# Patient Record
Sex: Female | Born: 1955 | Race: Black or African American | Hispanic: No | Marital: Single | Smoking: Heavy tobacco smoker
Health system: Southern US, Community
[De-identification: ages and names within clinical notes are randomized; demographics above are authoritative.]

## PROBLEM LIST (undated history)

## (undated) DIAGNOSIS — T7840XA Allergy, unspecified, initial encounter: Secondary | ICD-10-CM

## (undated) DIAGNOSIS — J349 Unspecified disorder of nose and nasal sinuses: Secondary | ICD-10-CM

## (undated) DIAGNOSIS — N189 Chronic kidney disease, unspecified: Secondary | ICD-10-CM

## (undated) DIAGNOSIS — J3489 Other specified disorders of nose and nasal sinuses: Secondary | ICD-10-CM

## (undated) DIAGNOSIS — I119 Hypertensive heart disease without heart failure: Secondary | ICD-10-CM

## (undated) DIAGNOSIS — I10 Essential (primary) hypertension: Secondary | ICD-10-CM

## (undated) HISTORY — DX: Other specified disorders of nose and nasal sinuses: J34.89

## (undated) HISTORY — DX: Essential (primary) hypertension: I10

## (undated) HISTORY — DX: Chronic kidney disease, unspecified: N18.9

## (undated) HISTORY — DX: Unspecified disorder of nose and nasal sinuses: J34.9

## (undated) HISTORY — DX: Hypertensive heart disease without heart failure: I11.9

## (undated) HISTORY — DX: Allergy, unspecified, initial encounter: T78.40XA

---

## 2011-09-05 ENCOUNTER — Other Ambulatory Visit: Payer: Self-pay | Admitting: Internal Medicine

## 2011-09-05 DIAGNOSIS — Z1231 Encounter for screening mammogram for malignant neoplasm of breast: Secondary | ICD-10-CM

## 2011-09-15 ENCOUNTER — Ambulatory Visit: Payer: Self-pay

## 2013-03-25 ENCOUNTER — Other Ambulatory Visit: Payer: Self-pay

## 2013-03-25 DIAGNOSIS — Z1231 Encounter for screening mammogram for malignant neoplasm of breast: Secondary | ICD-10-CM

## 2013-04-19 ENCOUNTER — Ambulatory Visit
Admission: RE | Admit: 2013-04-19 | Discharge: 2013-04-19 | Disposition: A | Discharge status: Home / Self Care | Payer: BC Managed Care – PPO | Source: Ambulatory Visit

## 2013-04-19 DIAGNOSIS — Z1231 Encounter for screening mammogram for malignant neoplasm of breast: Secondary | ICD-10-CM

## 2014-04-07 ENCOUNTER — Other Ambulatory Visit: Payer: Self-pay

## 2014-04-07 DIAGNOSIS — Z1231 Encounter for screening mammogram for malignant neoplasm of breast: Secondary | ICD-10-CM

## 2014-05-19 ENCOUNTER — Ambulatory Visit
Admission: RE | Admit: 2014-05-19 | Discharge: 2014-05-19 | Disposition: A | Discharge status: Home / Self Care | Payer: BC Managed Care – PPO | Source: Ambulatory Visit

## 2014-05-19 ENCOUNTER — Encounter (INDEPENDENT_AMBULATORY_CARE_PROVIDER_SITE_OTHER): Payer: Self-pay

## 2014-05-19 DIAGNOSIS — Z1231 Encounter for screening mammogram for malignant neoplasm of breast: Secondary | ICD-10-CM

## 2015-01-27 ENCOUNTER — Encounter: Payer: Self-pay | Admitting: Vascular Surgery

## 2015-01-27 ENCOUNTER — Other Ambulatory Visit: Payer: Self-pay

## 2015-01-27 DIAGNOSIS — I83813 Varicose veins of bilateral lower extremities with pain: Secondary | ICD-10-CM

## 2015-03-24 ENCOUNTER — Encounter (HOSPITAL_COMMUNITY): Payer: BC Managed Care – PPO

## 2015-03-24 ENCOUNTER — Encounter: Payer: BC Managed Care – PPO | Admitting: Vascular Surgery

## 2015-04-13 ENCOUNTER — Other Ambulatory Visit: Payer: Self-pay

## 2015-04-13 DIAGNOSIS — Z1231 Encounter for screening mammogram for malignant neoplasm of breast: Secondary | ICD-10-CM

## 2015-05-21 ENCOUNTER — Ambulatory Visit
Admission: RE | Admit: 2015-05-21 | Discharge: 2015-05-21 | Disposition: A | Discharge status: Home / Self Care | Payer: BC Managed Care – PPO | Source: Ambulatory Visit

## 2015-05-21 DIAGNOSIS — Z1231 Encounter for screening mammogram for malignant neoplasm of breast: Secondary | ICD-10-CM

## 2015-06-08 LAB — HM COLONOSCOPY

## 2016-04-11 ENCOUNTER — Other Ambulatory Visit: Payer: Self-pay | Admitting: Internal Medicine

## 2016-04-11 DIAGNOSIS — Z1231 Encounter for screening mammogram for malignant neoplasm of breast: Secondary | ICD-10-CM

## 2016-05-24 ENCOUNTER — Ambulatory Visit
Admission: RE | Admit: 2016-05-24 | Discharge: 2016-05-24 | Disposition: A | Discharge status: Home / Self Care | Payer: BC Managed Care – PPO | Source: Ambulatory Visit | Attending: Internal Medicine | Admitting: Internal Medicine

## 2016-05-24 DIAGNOSIS — Z1231 Encounter for screening mammogram for malignant neoplasm of breast: Secondary | ICD-10-CM

## 2016-07-26 ENCOUNTER — Other Ambulatory Visit: Payer: Self-pay | Admitting: Internal Medicine

## 2016-07-26 DIAGNOSIS — E2839 Other primary ovarian failure: Secondary | ICD-10-CM

## 2016-10-27 ENCOUNTER — Ambulatory Visit
Admission: RE | Admit: 2016-10-27 | Discharge: 2016-10-27 | Disposition: A | Discharge status: Home / Self Care | Payer: BC Managed Care – PPO | Source: Ambulatory Visit | Attending: Internal Medicine | Admitting: Internal Medicine

## 2016-10-27 DIAGNOSIS — E2839 Other primary ovarian failure: Secondary | ICD-10-CM

## 2016-11-29 ENCOUNTER — Institutional Professional Consult (permissible substitution): Payer: BC Managed Care – PPO | Admitting: Neurology

## 2016-12-01 ENCOUNTER — Encounter: Payer: Self-pay | Admitting: Neurology

## 2016-12-05 ENCOUNTER — Encounter: Payer: Self-pay | Admitting: Neurology

## 2016-12-05 ENCOUNTER — Ambulatory Visit (INDEPENDENT_AMBULATORY_CARE_PROVIDER_SITE_OTHER): Payer: BC Managed Care – PPO | Admitting: Neurology

## 2016-12-05 ENCOUNTER — Encounter (INDEPENDENT_AMBULATORY_CARE_PROVIDER_SITE_OTHER): Payer: Self-pay

## 2016-12-05 VITALS — BP 130/83 | HR 100 | Ht 65.0 in | Wt 167.0 lb

## 2016-12-05 DIAGNOSIS — Z720 Tobacco use: Secondary | ICD-10-CM | POA: Diagnosis not present

## 2016-12-05 DIAGNOSIS — R0683 Snoring: Secondary | ICD-10-CM

## 2016-12-05 DIAGNOSIS — G4763 Sleep related bruxism: Secondary | ICD-10-CM | POA: Diagnosis not present

## 2016-12-05 DIAGNOSIS — N951 Menopausal and female climacteric states: Secondary | ICD-10-CM | POA: Diagnosis not present

## 2016-12-05 DIAGNOSIS — J322 Chronic ethmoidal sinusitis: Secondary | ICD-10-CM

## 2016-12-05 DIAGNOSIS — F17200 Nicotine dependence, unspecified, uncomplicated: Secondary | ICD-10-CM | POA: Insufficient documentation

## 2016-12-05 NOTE — Progress Notes (Signed)
SLEEP MEDICINE CLINIC   Provider:  Melvyn Novas, M D  Primary Care Physician:  Dorothyann Peng, MD   Referring Provider: Dorothyann Peng, MD    Chief Complaint  Patient presents with  . New Patient (Initial Visit)    HPI:  Samantha Gomez is a 61 y.o. female , seen here as in a referral  from Dr. Allyne Gee for a sleep consultation,  Samantha Gomez is a 61 year old african american lady with bruxism, hot flashes at night, ongoing tobacco smoker. Her son had reported she snores.    Chief complaint according to patient :  Sleep habits are as follows: she sleeps with a fan in a room that quiet and dark and cool, yet she feels hot. Her bedtime is usually 8 PM , on vacation it will be 10 Pm. Yes, she watches TV for 30 minutes and will be asleep. TV is on a timer. She sleeps on her side, and she wakes on her back.  She sleeps with 1 pillow. She has rarely bathroom breaks, her arousal is usually between 2 and 4 AM when she feels hot. No palpitations, no nausea no headaches, no dizziness. She rarely has vivid dreams.  She wakes spontaneously at 5.30 AM, no need for an alarm. She feels refreshed , restored.   Sleep medical history and family sleep history: One sister- she  had OSA- hypersomnia/ considered narcolepsy.   Parents were not affected.    Social history:  Works from 8.15 Am through 4.15 in [employer]. Tobacco user for more than 3 decades, 1ppd , ETOH ( wine )  , 2 glasses a week. caffeine use  - drank Cola 2-3 a day ( gave this up last 6 month) , now in AM 2 cups of coffee,  No iced tea.   Review of Systems: Out of a complete 14 system review, the patient complains of only the following symptoms, and all other reviewed systems are negative. Snoring, coughing, phlegm, sinusitis. Had Pneumonia .   Epworth score 2  , Fatigue severity score 9  , depression score 2/15    I reviewed medication, nutritional supplements and her recent lab tests form Dr Allyne Gee. HbA1c 5.6, total  cholesterol under 200,  Social History   Social History  . Marital status: Single    Spouse name: N/A  . Number of children: N/A  . Years of education: N/A   Occupational History  . Not on file.   Social History Main Topics  . Smoking status: Heavy Tobacco Smoker    Years: 44.00  . Smokeless tobacco: Never Used  . Alcohol use Not on file  . Drug use: Unknown  . Sexual activity: Not on file   Other Topics Concern  . Not on file   Social History Narrative  . No narrative on file    No family history on file.  Past Medical History:  Diagnosis Date  . Benign hypertensive heart disease   . Chronic kidney disease   . Disorder of nasal cavity   . Hypertension     No past surgical history on file.  Current Outpatient Prescriptions  Medication Sig Dispense Refill  . b complex vitamins tablet Take 1 tablet by mouth daily.    . Cholecalciferol (VITAMIN D-3) 5000 units TABS Take by mouth daily.    . niacin 250 MG tablet Take 250 mg by mouth daily.    . Olmesartan-Amlodipine-HCTZ (TRIBENZOR) 40-5-25 MG TABS Take by mouth daily.     No  current facility-administered medications for this visit.     Allergies as of 12/05/2016  . (Not on File)    Vitals: BP 130/83   Pulse 100   Ht 5\' 5"  (1.651 m)   Wt 167 lb (75.8 kg)   BMI 27.79 kg/m  Last Weight:  Wt Readings from Last 1 Encounters:  12/05/16 167 lb (75.8 kg)   ZOX:WRUEBMI:Body mass index is 27.79 kg/m.     Last Height:   Ht Readings from Last 1 Encounters:  12/05/16 5\' 5"  (1.651 m)    Physical exam:  General: The patient is awake, alert and appears not in acute distress. The patient is well groomed. Head: Normocephalic, atraumatic. Neck is supple. Mallampati 4,  neck circumference:13.5 . Nasal airflow congested , TMJ click is evident . Retrognathia is mildly seen.  Cardiovascular:  Regular rate and rhythm , without  murmurs or carotid bruit, and without distended neck veins. Respiratory: Lungs, rhonci  noted, no  wheezing  to auscultation. Skin:  Without evidence of edema, or rash Trunk: BMI is 28. The patient's posture is erect   Neurologic exam : The patient is awake and alert, oriented to place and time.   Cranial nerves: Pupils are equal and briskly reactive to light.  Readers worn- no nystagmus or diplopia. Hearing to finger rub intact.   Facial sensation intact to fine touch.  Facial motor strength is symmetric and tongue and uvula move midline. Shoulder shrug was symmetrical.   Motor exam:   Normal tone, muscle bulk and symmetric strength in all extremities.  Sensory:  Fine touch, pinprick and vibration were tested in all extremities. Proprioception tested in the upper extremities was normal. Coordination: Rapid alternating movements in the fingers/hands was normal. Finger-to-nose maneuver  normal without evidence of ataxia, dysmetria or tremor. Gait and station: Patient walks without assistive device and is able unassisted to climb up to the exam table. Strength within normal limits.  Stance is stable and normal.   Deep tendon reflexes: in the  upper and lower extremities are symmetric and intact. Babinski maneuver response is downgoing.    Assessment:  After physical and neurologic examination, review of laboratory studies,  Personal review of imaging studies, reports of other /same  Imaging studies, results of polysomnography and / or neurophysiology testing and pre-existing records as far as provided in visit., my assessment is   1) Samantha Gomez's referral was based on her dentists concern for upper airway narrowing and bruxism. She is a smoker and she has been told that she snores. I will order an attended sleep study with CO2 in a  tobacco user. She has been suffering from delayed menopausal sleep interruptions- started at age [age].  May benefit from a treatment ( HRT low dose combi)  I will check for LMs, and for hypoxemia , OSA .    The patient was advised of the nature of the  diagnosed disorder , the treatment options and the  risks for general health and wellness arising from not treating the condition.   I spent more than 35 minutes of face to face time with the patient.  Greater than 50% of time was spent in counseling and coordination of care. We have discussed the diagnosis and differential and I answered the patient's questions.    Plan:  Treatment plan and additional workup :  SPLIT night PSG with capnography in tobacco user, snorer and with nocturnal heat-attacks.     Melvyn NovasARMEN Dempsy Damiano, MD 12/05/2016, 11:48 AM  Certified in  Neurology by ABPN Certified in [place] by [facility] [address] [place], [place] [address]

## 2016-12-05 NOTE — Patient Instructions (Signed)

## 2016-12-14 ENCOUNTER — Ambulatory Visit (INDEPENDENT_AMBULATORY_CARE_PROVIDER_SITE_OTHER): Payer: BC Managed Care – PPO | Admitting: Neurology

## 2016-12-14 DIAGNOSIS — G4763 Sleep related bruxism: Secondary | ICD-10-CM

## 2016-12-14 DIAGNOSIS — G4733 Obstructive sleep apnea (adult) (pediatric): Secondary | ICD-10-CM | POA: Diagnosis not present

## 2016-12-14 DIAGNOSIS — Z720 Tobacco use: Secondary | ICD-10-CM

## 2016-12-14 DIAGNOSIS — J322 Chronic ethmoidal sinusitis: Secondary | ICD-10-CM

## 2016-12-14 DIAGNOSIS — N951 Menopausal and female climacteric states: Secondary | ICD-10-CM

## 2016-12-14 DIAGNOSIS — R0683 Snoring: Secondary | ICD-10-CM

## 2016-12-19 ENCOUNTER — Other Ambulatory Visit: Payer: Self-pay | Admitting: Neurology

## 2016-12-19 NOTE — Addendum Note (Signed)
Addended by: Melvyn NovasHMEIER, Shahil Speegle on: 12/19/2016 12:43 PM   Modules accepted: Orders

## 2016-12-19 NOTE — Procedures (Signed)
PATIENT'S NAME:  Samantha Gomez, Samantha Gomez DOB:      02/16/1956      MR#:    [id number]     DATE OF RECORDING: 12/14/2016 REFERRING M.D.:  Dorothyann Pengobyn Sanders, MD Study Performed:   Baseline Polysomnogram HISTORY:  This 61 year old African-American female patient of Dr. Zella BallSander's presents with bruxism, hot flashes at night, is an active tobacco smoker and snores.  Here to rule out apnea; strong family history of OSA.  The patient endorsed the Epworth Sleepiness Scale at 2/ 24 points.   The patient's weight 167 pounds with a height of 65 (inches), resulting in a BMI of 27.9 kg/m2. The patient's neck circumference measured 13.5 inches.  CURRENT MEDICATIONS: Vitamin B complex; Vitamin D3; Tribenzor   PROCEDURE:  This is a multichannel digital polysomnogram utilizing the SomnoStar 11.2 system.  Electrodes and sensors were applied and monitored per AASM Specifications.   EEG, EOG, Chin and Limb EMG, were sampled at 200 Hz.  ECG, Snore and Nasal Pressure, Thermal Airflow, Respiratory Effort, CPAP Flow and Pressure, Oximetry was sampled at 50 Hz. Digital video and audio were recorded.      BASELINE STUDY  Lights Out was at 22:21 and Lights On at 05:02.  Total recording time (TRT) was 401 minutes, with a total sleep time (TST) of 331 minutes.   The patient's sleep latency was 50 minutes.  REM latency was 132.5 minutes.  The sleep efficiency was 82.5 %.     SLEEP ARCHITECTURE: WASO (Wake after sleep onset) was 19.5 minutes.  There were 8 minutes in Stage N1, 186.5 minutes Stage N2, 85.5 minutes Stage N3 and 51 minutes in Stage REM.  The percentage of Stage N1 was 2.4%, Stage N2 was 56.3%, Stage N3 was 25.8% and Stage R (REM sleep) was 15.4%.   EKG was in keeping with normal sinus rhythm (NSR).  RESPIRATORY ANALYSIS:  There were a total of 10 respiratory events:  6 obstructive apneas, 4 hypopneas with 0 respiratory event related arousals (RERAs).     The total APNEA/HYPOPNEA INDEX (AHI) was 1.8/hour and the total  RESPIRATORY DISTURBANCE INDEX was 1.8 /hour.  3 events occurred in REM sleep and 2 events in NREM. The REM AHI was 3.5 /hour, versus a non-REM AHI of 1.5. The patient spent 11.5 minutes of total sleep time in the supine position and 320 minutes in non-supine. The supine AHI was 31.3 versus a non-supine AHI of 0.8.  OXYGEN SATURATION & C02:  The Wake baseline 02 saturation was 98%, with the lowest being 88%. Time spent below 89% saturation equaled 1 minute. Total sleep time greater than CO2 40 torr was 1.00 minutes.     PERIODIC LIMB MOVEMENTS/AROUSALS:  The patient had a total of 0 Periodic Limb Movements.  The arousals were noted as: 36 were spontaneous, 0 were associated with PLMs, and 8 were associated with respiratory events.  Audio and video analysis did not show any abnormal or unusual movements, behaviors, phonations or vocalizations.   Mild Snoring was noted. EKG in NSR. Bruxism noticed in supine sleep. Post-study, the patient indicated that sleep was the same as usual.    IMPRESSION: Mild to moderate snoring.  No significant apnea, rare PLMs, NSR in EKG, no significant hypoxemia.  A physiologic sleep disorder was not identified.    RECOMMENDATIONS:  1. A follow up appointment in the [facility] at [facility] is not needed. If the patient prefers, I will discuss the findings and refer for snoring treatments through  dentist. There may be a chance to treat bruxism and snoring with one device.  2. The referring provider will be notified of the results.      I certify that I have reviewed the entire raw data recording prior to the issuance of this report in accordance with the Standards of Accreditation of the American Academy of Sleep Medicine (AASM)      Melvyn Novasarmen Deklin Bieler, MD    12-19-2016  Diplomat, American Board of Psychiatry and Neurology  Diplomat, American Board of Sleep Medicine Medical Director, [facility]

## 2016-12-20 ENCOUNTER — Telehealth: Payer: Self-pay | Admitting: Neurology

## 2016-12-20 NOTE — Telephone Encounter (Signed)
-----   Message from Carmen Dohmeier, MD sent at 12/19/2016 12:46 PM EDT ----- Please disregard the note from 12.43 PM. ERROR. 

## 2016-12-20 NOTE — Telephone Encounter (Signed)
Called to discuss sleep study results. Pt not at home. Left message for pt to call back.

## 2016-12-21 ENCOUNTER — Other Ambulatory Visit: Payer: Self-pay | Admitting: Neurology

## 2016-12-21 ENCOUNTER — Telehealth: Payer: Self-pay | Admitting: Neurology

## 2016-12-21 NOTE — Telephone Encounter (Signed)
-----   Message from Melvyn Novasarmen Dohmeier, MD sent at 12/19/2016 12:46 PM EDT ----- Please disregard the note from 12.43 PM. ERROR.

## 2016-12-21 NOTE — Telephone Encounter (Signed)
Discussed sleep study results and pt made aware that she had no evidence of sleep apnea. Explained there was mild to moderate sleep snoring with teeth grinding noted. Pt stated [facility] and would like to be referred back to them for the dental device.

## 2017-02-23 ENCOUNTER — Other Ambulatory Visit: Payer: Self-pay | Admitting: Internal Medicine

## 2017-04-25 ENCOUNTER — Other Ambulatory Visit: Payer: Self-pay | Admitting: Internal Medicine

## 2017-04-25 DIAGNOSIS — Z1231 Encounter for screening mammogram for malignant neoplasm of breast: Secondary | ICD-10-CM

## 2017-06-01 ENCOUNTER — Ambulatory Visit
Admission: RE | Admit: 2017-06-01 | Discharge: 2017-06-01 | Disposition: A | Discharge status: Home / Self Care | Payer: BC Managed Care – PPO | Source: Ambulatory Visit | Attending: Internal Medicine | Admitting: Internal Medicine

## 2017-06-01 DIAGNOSIS — Z1231 Encounter for screening mammogram for malignant neoplasm of breast: Secondary | ICD-10-CM

## 2017-06-02 ENCOUNTER — Other Ambulatory Visit: Payer: Self-pay | Admitting: Internal Medicine

## 2017-06-02 DIAGNOSIS — R928 Other abnormal and inconclusive findings on diagnostic imaging of breast: Secondary | ICD-10-CM

## 2017-06-13 ENCOUNTER — Ambulatory Visit
Admission: RE | Admit: 2017-06-13 | Discharge: 2017-06-13 | Disposition: A | Discharge status: Home / Self Care | Payer: BC Managed Care – PPO | Source: Ambulatory Visit | Attending: Internal Medicine | Admitting: Internal Medicine

## 2017-06-13 DIAGNOSIS — R928 Other abnormal and inconclusive findings on diagnostic imaging of breast: Secondary | ICD-10-CM

## 2018-03-31 ENCOUNTER — Encounter: Payer: Self-pay | Admitting: Internal Medicine

## 2018-03-31 ENCOUNTER — Ambulatory Visit (INDEPENDENT_AMBULATORY_CARE_PROVIDER_SITE_OTHER): Payer: BC Managed Care – PPO | Admitting: Internal Medicine

## 2018-03-31 VITALS — BP 122/70 | HR 88 | Temp 98.1°F | Ht 63.5 in | Wt 164.8 lb

## 2018-03-31 DIAGNOSIS — I10 Essential (primary) hypertension: Secondary | ICD-10-CM

## 2018-03-31 DIAGNOSIS — M79641 Pain in right hand: Secondary | ICD-10-CM

## 2018-03-31 DIAGNOSIS — M79642 Pain in left hand: Secondary | ICD-10-CM | POA: Diagnosis not present

## 2018-03-31 DIAGNOSIS — Z0000 Encounter for general adult medical examination without abnormal findings: Secondary | ICD-10-CM

## 2018-03-31 DIAGNOSIS — H9201 Otalgia, right ear: Secondary | ICD-10-CM | POA: Diagnosis not present

## 2018-03-31 LAB — POCT UA - MICROALBUMIN
Albumin/Creatinine Ratio, Urine, POC: <30
CREATININE, POC: 50 mg/dL
MICROALBUMIN (UR) POC: 10 mg/L

## 2018-03-31 NOTE — Progress Notes (Signed)
Subjective:     Patient ID: Samantha Gomez , female    DOB: 1956/01/22 , 62 y.o.   MRN: [id number]   Chief Complaint  Patient presents with  . Annual Exam  . Hypertension    HPI  She is here today for a full physical examination. Her last pap smear was in 2018.   Hypertension  This is a chronic problem. The current episode started more than 1 year ago. The problem has been gradually improving since onset. Pertinent negatives include no blurred vision, chest pain, headaches, palpitations or shortness of breath. Risk factors for coronary artery disease include post-menopausal state, sedentary lifestyle, smoking/tobacco exposure and stress.  She reports compliance with her meds.    Past Medical History:  Diagnosis Date  . Benign hypertensive heart disease   . Chronic kidney disease   . Disorder of nasal cavity   . Hypertension      Family History  Problem Relation Age of Onset  . Dementia Mother   . Lung cancer Father      No LMP recorded. Patient is postmenopausal.. Negative for: breast discharge, breast lump(s), breast pain and breast self exam. Associated symptoms include abnormal vaginal bleeding. Pertinent negatives include abnormal bleeding (hematology), anxiety, decreased libido, depression, difficulty falling sleep, dyspareunia, history of infertility, nocturia, sexual dysfunction, sleep disturbances, urinary incontinence, urinary urgency, vaginal discharge and vaginal itching. Diet regular.The patient states her exercise level is    . The patient's tobacco use is:  Social History   Tobacco Use  Smoking Status Light Tobacco Smoker  . Packs/day: 0.25  . Years: 44.00  . Pack years: 11.00  Smokeless Tobacco Never Used  . She has been exposed to passive smoke. The patient's alcohol use is:  Social History   Substance and Sexual Activity  Alcohol Use Yes  . Alcohol/week: 2.0 standard drinks  . Types: 2 Shots of liquor per week  . Additional information: Last pap 2018  next one scheduled for 2021.    Current Outpatient Medications:  .  b complex vitamins tablet, Take 1 tablet by mouth daily., Disp: , Rfl:  .  Cholecalciferol (VITAMIN D-3) 5000 units TABS, Take by mouth daily., Disp: , Rfl:  .  niacin 250 MG tablet, Take 250 mg by mouth daily., Disp: , Rfl:  .  Olmesartan-Amlodipine-HCTZ (TRIBENZOR) 40-5-25 MG TABS, Take by mouth daily., Disp: , Rfl:     No Known Allergies   Review of Systems  Constitutional: Negative.   HENT: Ear pain: c/o R ear pain. feels stopped up. Denies hearing loss.   Eyes: Negative.  Negative for blurred vision.  Respiratory: Negative.  Negative for shortness of breath.   Cardiovascular: Negative.  Negative for chest pain and palpitations.  Gastrointestinal: Negative.   Endocrine: Negative.   Genitourinary: Negative.   Musculoskeletal: Positive for arthralgias (c/o b/l hand pain. c/o stiffness upon awakening. hands feel tight. ).  Skin: Negative.   Allergic/Immunologic: Negative.   Neurological: Negative.  Negative for headaches.  Hematological: Negative.   Psychiatric/Behavioral: Negative.      Today's Vitals   03/31/18 0935  BP: 122/70  Pulse: 88  Temp: 98.1 F (36.7 C)  TempSrc: Oral  Weight: 164 lb 12.8 oz (74.8 kg)  Height: 5' 3.5" (1.613 m)   Body mass index is 28.74 kg/m.   Objective:  Physical Exam  Constitutional: She is oriented to person, place, and time. She appears well-developed and well-nourished.  HENT:  Head: Normocephalic and atraumatic.  Right Ear: Hearing, tympanic membrane,  external ear and ear canal normal.  Left Ear: Hearing, tympanic membrane, external ear and ear canal normal.  Nose: Nose normal.  Mouth/Throat: Oropharynx is clear and moist.  Eyes: Pupils are equal, round, and reactive to light. Conjunctivae and EOM are normal.  Neck: Normal range of motion. Neck supple.  Cardiovascular: Normal rate, regular rhythm, normal heart sounds and intact distal pulses.  Pulmonary/Chest:  Effort normal and breath sounds normal. Right breast exhibits no inverted nipple, no mass, no nipple discharge, no skin change and no tenderness. Left breast exhibits no inverted nipple, no mass, no nipple discharge, no skin change and no tenderness.  Abdominal: Soft. Bowel sounds are normal.  Genitourinary:  Genitourinary Comments: deferred  Musculoskeletal:  Pos squeeze test on the right  Neurological: She is alert and oriented to person, place, and time.  Skin: Skin is warm and dry.  Psychiatric: She has a normal mood and affect.  Nursing note and vitals reviewed.       Assessment And Plan:     1. Routine general medical examination at health care facility  A full exam was performed. Importance of monthly self breast exams was discussed with the patient.  PATIENT HAS BEEN ADVISED TO GET 30-45 MINUTES REGULAR EXERCISE NO LESS THAN FOUR TO FIVE DAYS PER WEEK - BOTH WEIGHTBEARING EXERCISES AND AEROBIC ARE RECOMMENDED.  SHE IS ADVISED TO FOLLOW A HEALTHY DIET WITH AT LEAST SIX FRUITS/VEGGIES PER DAY, DECREASE INTAKE OF RED MEAT, AND TO INCREASE FISH INTAKE TO TWO DAYS PER WEEK.  MEATS/FISH SHOULD NOT BE FRIED, BAKED OR BROILED IS PREFERABLE.  I SUGGEST WEARING SPF 50 SUNSCREEN ON EXPOSED PARTS AND ESPECIALLY WHEN IN THE DIRECT SUNLIGHT FOR AN EXTENDED PERIOD OF TIME.  PLEASE AVOID FAST FOOD RESTAURANTS AND INCREASE YOUR WATER INTAKE.  - CMP14+EGFR - CBC - Lipid panel - Hemoglobin A1c - ANA, IFA (with reflex)  2. Essential hypertension, benign  Well controlled. She will continue with current meds. She is encouraged to avoid adding salt to her foods. She will RTO in six months for re-evaluation.   - EKG 12-Lead  3. Bilateral hand pain  Squeeze test positive on the right. I will check an ANA today.   4. Otalgia of right ear  No abnormalities noted. She will try otc loratadine daily prn. She will call me next week to let me know how she is feeling. I will consider phenylephrine  product if her sx persist.     Maximino Greenland, MD

## 2018-03-31 NOTE — Patient Instructions (Signed)

## 2018-04-02 LAB — POCT URINALYSIS DIPSTICK
Bilirubin, UA: NEGATIVE
GLUCOSE UA: NEGATIVE
Ketones, UA: NEGATIVE
Leukocytes, UA: NEGATIVE
Nitrite, UA: NEGATIVE
Protein, UA: NEGATIVE
Urobilinogen, UA: 0.2 E.U./dL
pH, UA: 6 (ref 5.0–8.0)

## 2018-04-02 LAB — CMP14+EGFR
A/G RATIO: 2.4 — AB (ref 1.2–2.2)
ALT: 28 IU/L (ref 0–32)
AST: 31 IU/L (ref 0–40)
Albumin: 4.3 g/dL (ref 3.6–4.8)
Alkaline Phosphatase: 77 IU/L (ref 39–117)
BUN/Creatinine Ratio: 15 (ref 12–28)
BUN: 13 mg/dL (ref 8–27)
Bilirubin Total: 0.3 mg/dL (ref 0.0–1.2)
CO2: 23 mmol/L (ref 20–29)
Calcium: 9.2 mg/dL (ref 8.7–10.3)
Chloride: 99 mmol/L (ref 96–106)
Creatinine, Ser: 0.89 mg/dL (ref 0.57–1.00)
GFR, EST AFRICAN AMERICAN: 80 mL/min/{1.73_m2} (ref >59)
GFR, EST NON AFRICAN AMERICAN: 69 mL/min/{1.73_m2} (ref >59)
GLOBULIN, TOTAL: 1.8 g/dL (ref 1.5–4.5)
Glucose: 96 mg/dL (ref 65–99)
POTASSIUM: 4 mmol/L (ref 3.5–5.2)
SODIUM: 139 mmol/L (ref 134–144)
Total Protein: 6.1 g/dL (ref 6.0–8.5)

## 2018-04-02 LAB — LIPID PANEL
Chol/HDL Ratio: 2.3 ratio (ref 0.0–4.4)
Cholesterol, Total: 197 mg/dL (ref 100–199)
HDL: 87 mg/dL (ref >39)
LDL Calculated: 101 mg/dL — ABNORMAL HIGH (ref 0–99)
Triglycerides: 45 mg/dL (ref 0–149)
VLDL Cholesterol Cal: 9 mg/dL (ref 5–40)

## 2018-04-02 LAB — CBC
Hematocrit: 40.5 % (ref 34.0–46.6)
Hemoglobin: 13.4 g/dL (ref 11.1–15.9)
MCH: 28.9 pg (ref 26.6–33.0)
MCHC: 33.1 g/dL (ref 31.5–35.7)
MCV: 87 fL (ref 79–97)
PLATELETS: 233 10*3/uL (ref 150–450)
RBC: 4.64 x10E6/uL (ref 3.77–5.28)
RDW: 13 % (ref 12.3–15.4)
WBC: 3.9 10*3/uL (ref 3.4–10.8)

## 2018-04-02 LAB — ANTINUCLEAR ANTIBODIES, IFA: ANA TITER 1: NEGATIVE

## 2018-04-02 LAB — HEMOGLOBIN A1C
Est. average glucose Bld gHb Est-mCnc: 108 mg/dL
Hgb A1c MFr Bld: 5.4 % (ref 4.8–5.6)

## 2018-04-02 NOTE — Progress Notes (Signed)
Your liver and kidney fxn are nl. Your blood count is nl.  Your chol is great. You are not prediabetic. Your ANA is neg - this is test to eval for possible rheumatoid arthritis. 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 You may try otc Aspercreme - apply to affected areas bid as needed. I hope this works for you.

## 2018-07-24 ENCOUNTER — Other Ambulatory Visit: Payer: Self-pay | Admitting: Internal Medicine

## 2018-07-24 DIAGNOSIS — Z1231 Encounter for screening mammogram for malignant neoplasm of breast: Secondary | ICD-10-CM

## 2018-07-27 ENCOUNTER — Other Ambulatory Visit: Payer: Self-pay | Admitting: Internal Medicine

## 2018-08-21 ENCOUNTER — Ambulatory Visit: Payer: BC Managed Care – PPO

## 2018-09-18 ENCOUNTER — Ambulatory Visit: Payer: BC Managed Care – PPO

## 2018-09-22 ENCOUNTER — Ambulatory Visit: Payer: BC Managed Care – PPO | Admitting: Internal Medicine

## 2018-09-24 ENCOUNTER — Other Ambulatory Visit: Payer: Self-pay | Admitting: Internal Medicine

## 2018-09-24 ENCOUNTER — Ambulatory Visit (INDEPENDENT_AMBULATORY_CARE_PROVIDER_SITE_OTHER): Payer: BC Managed Care – PPO | Admitting: Internal Medicine

## 2018-09-24 ENCOUNTER — Other Ambulatory Visit: Payer: Self-pay

## 2018-09-24 ENCOUNTER — Encounter: Payer: Self-pay | Admitting: Internal Medicine

## 2018-09-24 VITALS — BP 110/64 | HR 87 | Temp 98.1°F | Ht 63.5 in | Wt 167.2 lb

## 2018-09-24 DIAGNOSIS — Z122 Encounter for screening for malignant neoplasm of respiratory organs: Secondary | ICD-10-CM | POA: Diagnosis not present

## 2018-09-24 DIAGNOSIS — F17210 Nicotine dependence, cigarettes, uncomplicated: Secondary | ICD-10-CM

## 2018-09-24 DIAGNOSIS — I10 Essential (primary) hypertension: Secondary | ICD-10-CM

## 2018-09-24 DIAGNOSIS — E663 Overweight: Secondary | ICD-10-CM

## 2018-09-24 NOTE — Progress Notes (Signed)
Subjective:     Patient ID: Samantha Gomez , female    DOB: 05/06/1956 , 63 y.o.   MRN: [id number]   Chief Complaint  Patient presents with  . Hypertension    HPI  She is here today for bp check. She has been following social distancing/stay at home orders. She does not mind being at home. She loves to garden.   Hypertension  This is a chronic problem. The current episode started more than 1 year ago. The problem is controlled. Pertinent negatives include no blurred vision, chest pain, palpitations or shortness of breath. Risk factors for coronary artery disease include smoking/tobacco exposure, stress and post-menopausal state.     Past Medical History:  Diagnosis Date  . Benign hypertensive heart disease   . Chronic kidney disease   . Disorder of nasal cavity   . Hypertension      Family History  Problem Relation Age of Onset  . Dementia Mother   . Lung cancer Father      Current Outpatient Medications:  .  b complex vitamins tablet, Take 1 tablet by mouth daily., Disp: , Rfl:  .  Cholecalciferol (VITAMIN D-3) 5000 units TABS, Take by mouth daily., Disp: , Rfl:  .  niacin 250 MG tablet, Take 250 mg by mouth daily., Disp: , Rfl:  .  TRIBENZOR 40-5-25 MG TABS, TAKE 1 TABLET BY MOUTH DAILY., Disp: 90 tablet, Rfl: 2   No Known Allergies   Review of Systems  Constitutional: Negative.   Eyes: Negative for blurred vision.  Respiratory: Negative.  Negative for shortness of breath.   Cardiovascular: Negative.  Negative for chest pain and palpitations.  Gastrointestinal: Negative.   Neurological: Negative.   Psychiatric/Behavioral: Negative.      Today's Vitals   09/24/18 1111  BP: 110/64  Pulse: 87  Temp: 98.1 F (36.7 C)  TempSrc: Oral  Weight: 167 lb 3.2 oz (75.8 kg)  Height: 5' 3.5" (1.613 m)   Body mass index is 29.15 kg/m.   Objective:  Physical Exam Vitals signs and nursing note reviewed.  Constitutional:      Appearance: Normal appearance.  HENT:    Head: Normocephalic and atraumatic.  Cardiovascular:     Rate and Rhythm: Normal rate and regular rhythm.     Heart sounds: Normal heart sounds.  Pulmonary:     Effort: Pulmonary effort is normal.     Breath sounds: Normal breath sounds.  Skin:    General: Skin is warm.  Neurological:     General: No focal deficit present.     Mental Status: She is alert.  Psychiatric:        Mood and Affect: Mood normal.        Behavior: Behavior normal.         Assessment And Plan:     1. Essential hypertension, benign  Well controlled. She will continue with current meds. She is encouraged to avoid adding salt to her foods. She will rto in six months for a full physical examination.   - CMP14+EGFR - Lipid Profile  2. Cigarette nicotine dependence without complication  She has greater than 30 pack year history of tobacco use. She does not wish to take medications to help her quit. She is encouraged to avoid smoking both in her car and in her home. She is also encouraged to decrease number of cigs smoked per day.   3. Overweight with body mass index (BMI) 25.0-29.9  BMI 29. She is encouraged to exercise   no less than 30 minutes five days per week. Importance of dietary compliance was also discussed with the patient.   4. Encounter for screening for malignant neoplasm of respiratory organs  - CT CHEST LUNG CA SCREEN LOW DOSE W/O CM; Future        Maximino Greenland, MD    THE PATIENT IS ENCOURAGED TO PRACTICE SOCIAL DISTANCING DUE TO THE COVID-19 PANDEMIC.

## 2018-09-24 NOTE — Patient Instructions (Signed)
Smoking and Musculoskeletal Health Smoking is bad for your health. Most people know that smoking causes lung disease, heart disease, and cancer. But people may not realize that it also affects their bones, muscles, and joints (musculoskeletal system). When you smoke, the effects on your lungs and heart result in less oxygen for your musculoskeletal system. This can lead to poor bone and joint health. How can smoking affect my musculoskeletal health? Smoking can:  Increase your risk of having weak, thin bones (osteoporosis). Elderly smokers are at higher risk for bone fractures related to osteoporosis.  Decrease the ability of bone-forming cells to make and replace bone (in addition to reducing oxygen and blood flow).  Reduce your body's ability to absorb calcium from your diet. Less calcium means weaker bones.  Interfere with the breakdown of the female hormone estrogen. Smoking lowers estrogen, which is a hormone that helps keep bones strong. Women who smoke may have earlier menopause. Menopause is a risk factor for osteoporosis.  Weaken the tissues that attach bones to muscles (tendons). This can lead to shoulder, back, and other joint injuries.  Increase your risk of rheumatoid arthritis or make the condition worse if you already have it.  Slow down healing and increase your risk of infection and other complications if you have a bone fracture or surgery that involves your musculoskeletal system.  Make you get out of breath easily. This can keep you from getting the exercise you need to keep your bones and joints healthy.  Decrease your appetite and body mass. You may lose weight and muscle strength. This can put you at higher risk for muscle injury, joint injury, and broken bones. What actions can I take to prevent musculoskeletal problems? Quit smoking      Do not start smoking. Quit if you already do. Even stopping later in life can improve musculoskeletal health.  Do not use any  products that contain nicotine or tobacco. Do not replace cigarette smoking with e-cigarettes. The safety of e-cigarettes is not known, and some may contain harmful chemicals.  Make a plan to quit smoking and commit to it. Look for programs to help you, and ask your health care provider for recommendations and ideas.  Talk with your health care provider about using nicotine replacement medicines to help you quit, such as gum, lozenges, patches, sprays, or pills. Make other lifestyle changes   Eat a healthy diet that includes calcium and vitamin D. These nutrients are important for bone health. ? Calcium is found in dairy foods and green leafy vegetables. ? Vitamin D is found in eggs, fish, and liver. ? Many foods also have vitamin D and calcium added to them (are fortified). ? Ask your health care provider if you would benefit from taking a supplement.  Get out in the sunshine for a short time every day. This increases production of vitamin D.  Get 30 minutes of exercise at least 5 days a week. Weight-bearing and strength exercises are best for musculoskeletal health. Ask your health care provider what type of exercise is safe for you.  Do not drink alcohol if: ? Your health care provider tells you not to drink. ? You are pregnant, may be pregnant, or are planning to become pregnant.  If you drink alcohol, limit how much you have: ? 0-1 drink a day for women. ? 0-2 drinks a day for men.  Be aware of how much alcohol is in your drink. In the [place] one drink equals one 12 oz bottle   of beer (355 mL), one 5 oz glass of wine (148 mL), or one 1 oz glass of hard liquor (44 mL). Where to find more information You may find more information about smoking, musculoskeletal health, and quitting smoking from:  American Academy of Orthopaedic Surgeons: orthoinfo.aaos.org  National Institutes of Health, Osteoporosis and Related Bone Diseases National Resource Center: bones.nih.gov  HelpGuide.org:  helpguide.org  Smokefree.gov: smokefree.gov  American Lung Association: lung.org Contact a health care provider if:  You need help to quit smoking. Summary  When you smoke, the effects on your lungs and heart result in less oxygen for your musculoskeletal system.  Even stopping smoking later in life can improve musculoskeletal health.  Do not use any products that contain nicotine or tobacco, such as cigarettes and e-cigarettes.  If you need help quitting, ask your health care provider. This information is not intended to replace advice given to you by your health care provider. Make sure you discuss any questions you have with your health care provider. Document Released: 09/04/2017 Document Revised: 09/04/2017 Document Reviewed: 09/04/2017 Elsevier Interactive Patient Education  2019 Elsevier Inc.  

## 2018-09-25 LAB — CMP14+EGFR
ALT: 25 IU/L (ref 0–32)
AST: 23 IU/L (ref 0–40)
Albumin/Globulin Ratio: 2.2 (ref 1.2–2.2)
Albumin: 4.2 g/dL (ref 3.8–4.8)
Alkaline Phosphatase: 81 IU/L (ref 39–117)
BUN/Creatinine Ratio: 14 (ref 12–28)
BUN: 14 mg/dL (ref 8–27)
Bilirubin Total: 0.3 mg/dL (ref 0.0–1.2)
CO2: 24 mmol/L (ref 20–29)
Calcium: 9.1 mg/dL (ref 8.7–10.3)
Chloride: 100 mmol/L (ref 96–106)
Creatinine, Ser: 0.98 mg/dL (ref 0.57–1.00)
GFR calc Af Amer: 71 mL/min/{1.73_m2} (ref >59)
GFR calc non Af Amer: 62 mL/min/{1.73_m2} (ref >59)
Globulin, Total: 1.9 g/dL (ref 1.5–4.5)
Glucose: 103 mg/dL — ABNORMAL HIGH (ref 65–99)
Potassium: 3.8 mmol/L (ref 3.5–5.2)
Sodium: 138 mmol/L (ref 134–144)
Total Protein: 6.1 g/dL (ref 6.0–8.5)

## 2018-09-25 LAB — LIPID PANEL
Chol/HDL Ratio: 2 ratio (ref 0.0–4.4)
Cholesterol, Total: 196 mg/dL (ref 100–199)
HDL: 97 mg/dL (ref >39)
LDL Calculated: 89 mg/dL (ref 0–99)
Triglycerides: 50 mg/dL (ref 0–149)
VLDL Cholesterol Cal: 10 mg/dL (ref 5–40)

## 2018-11-13 ENCOUNTER — Other Ambulatory Visit: Payer: Self-pay

## 2018-11-13 ENCOUNTER — Ambulatory Visit
Admission: RE | Admit: 2018-11-13 | Discharge: 2018-11-13 | Disposition: A | Discharge status: Home / Self Care | Payer: BC Managed Care – PPO | Source: Ambulatory Visit | Attending: Internal Medicine | Admitting: Internal Medicine

## 2018-11-13 DIAGNOSIS — Z1231 Encounter for screening mammogram for malignant neoplasm of breast: Secondary | ICD-10-CM

## 2018-11-26 IMAGING — MG DIGITAL DIAGNOSTIC UNILATERAL RIGHT MAMMOGRAM
3 series · 3 of 3 positions shown · non-contrast
Comparison: Previous exam(s).

CLINICAL DATA: Callback from screening mammogram for calcifications
right breast

EXAM:
DIGITAL DIAGNOSTIC RIGHT MAMMOGRAM

[R CC]
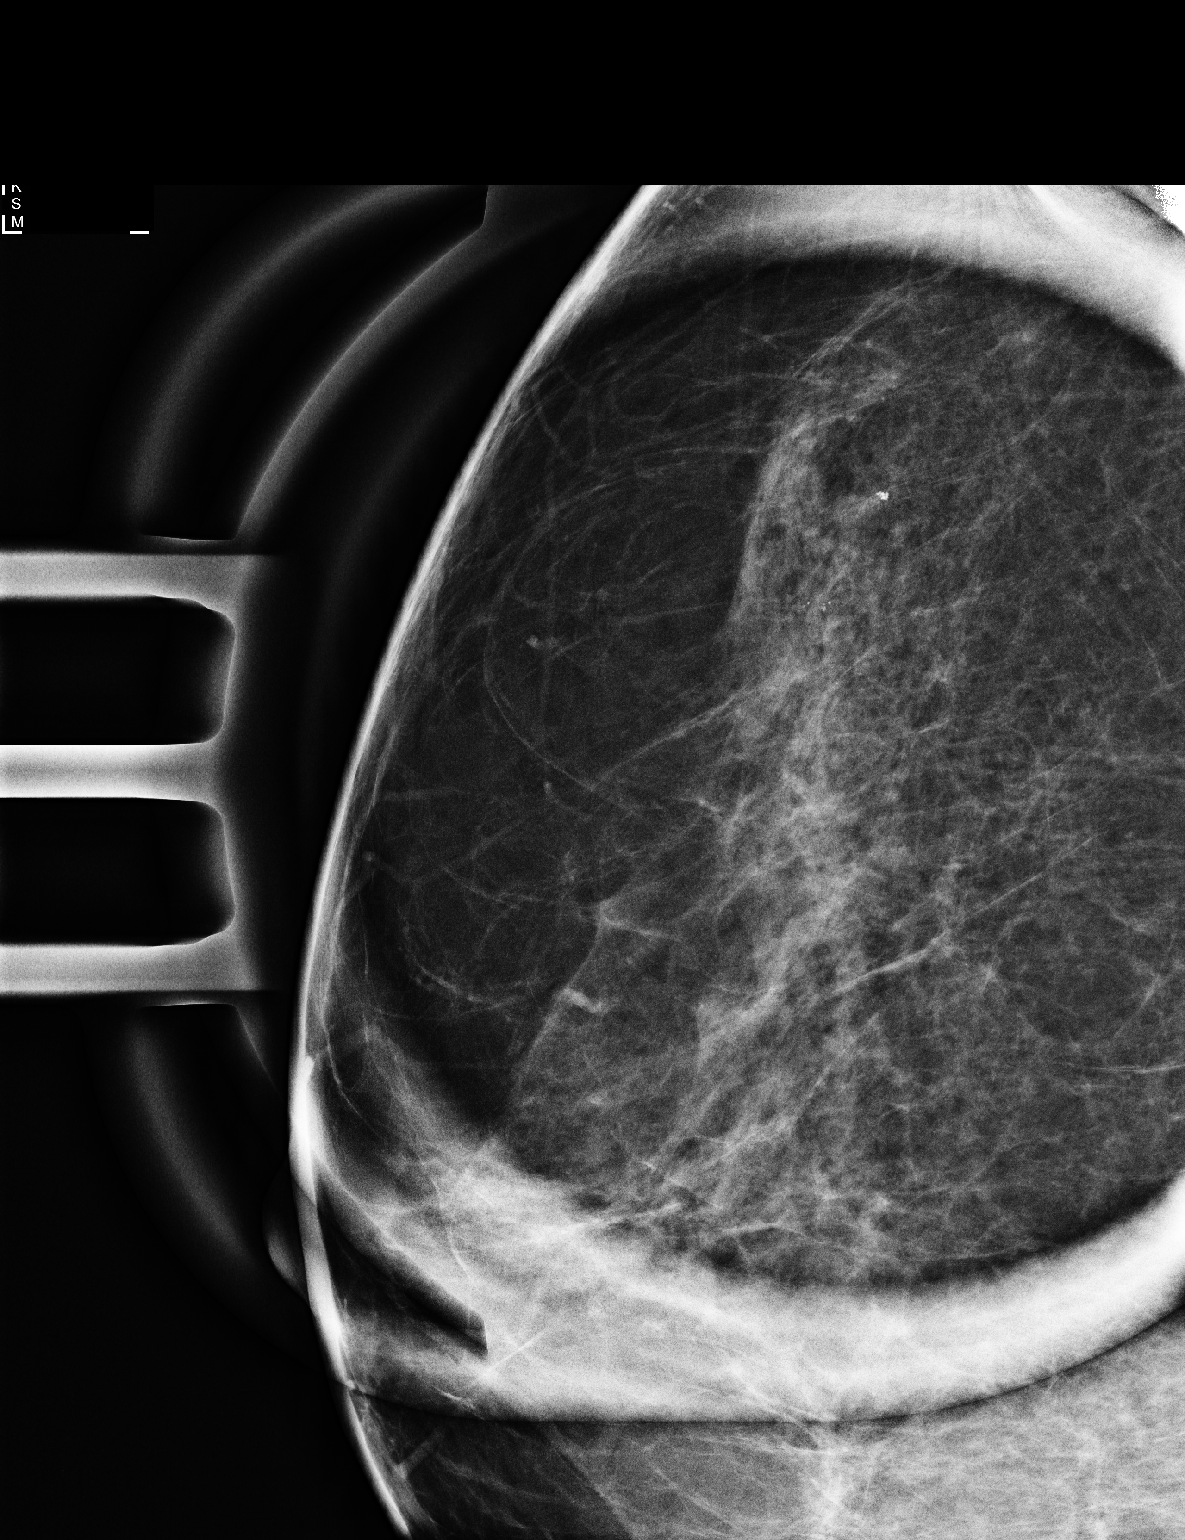

[R ML (1 of 2)]
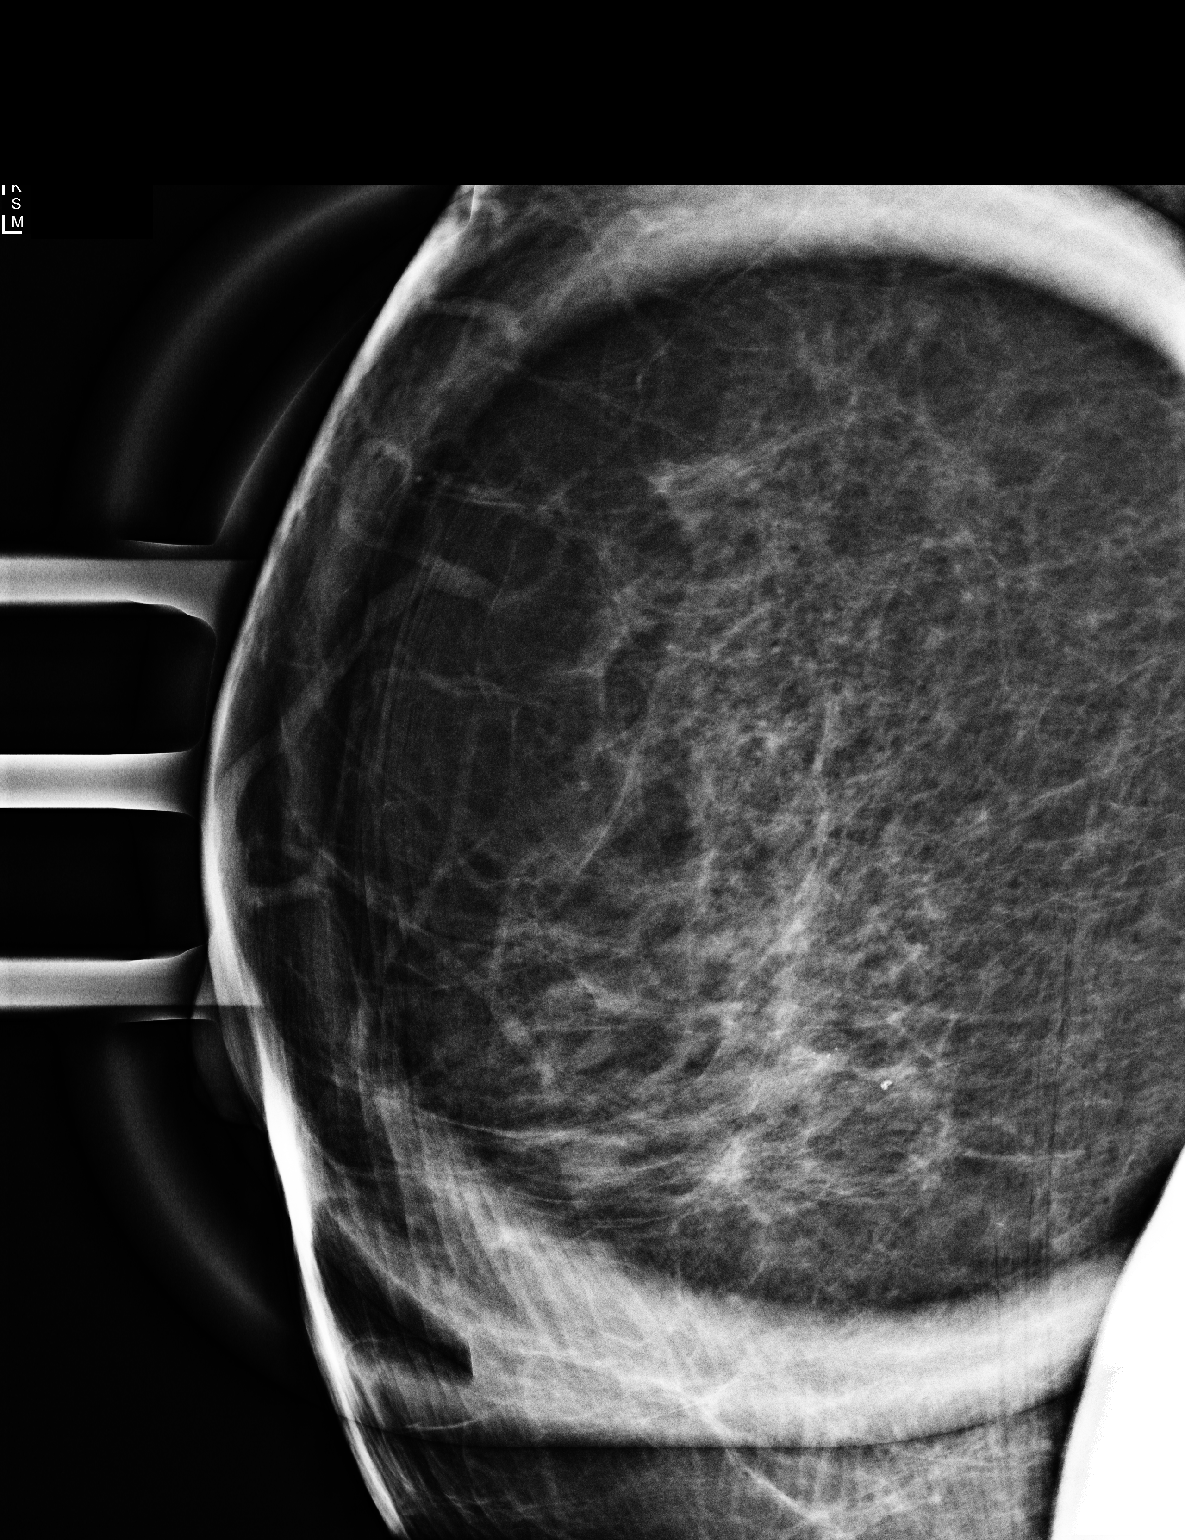

[R ML (2 of 2)]
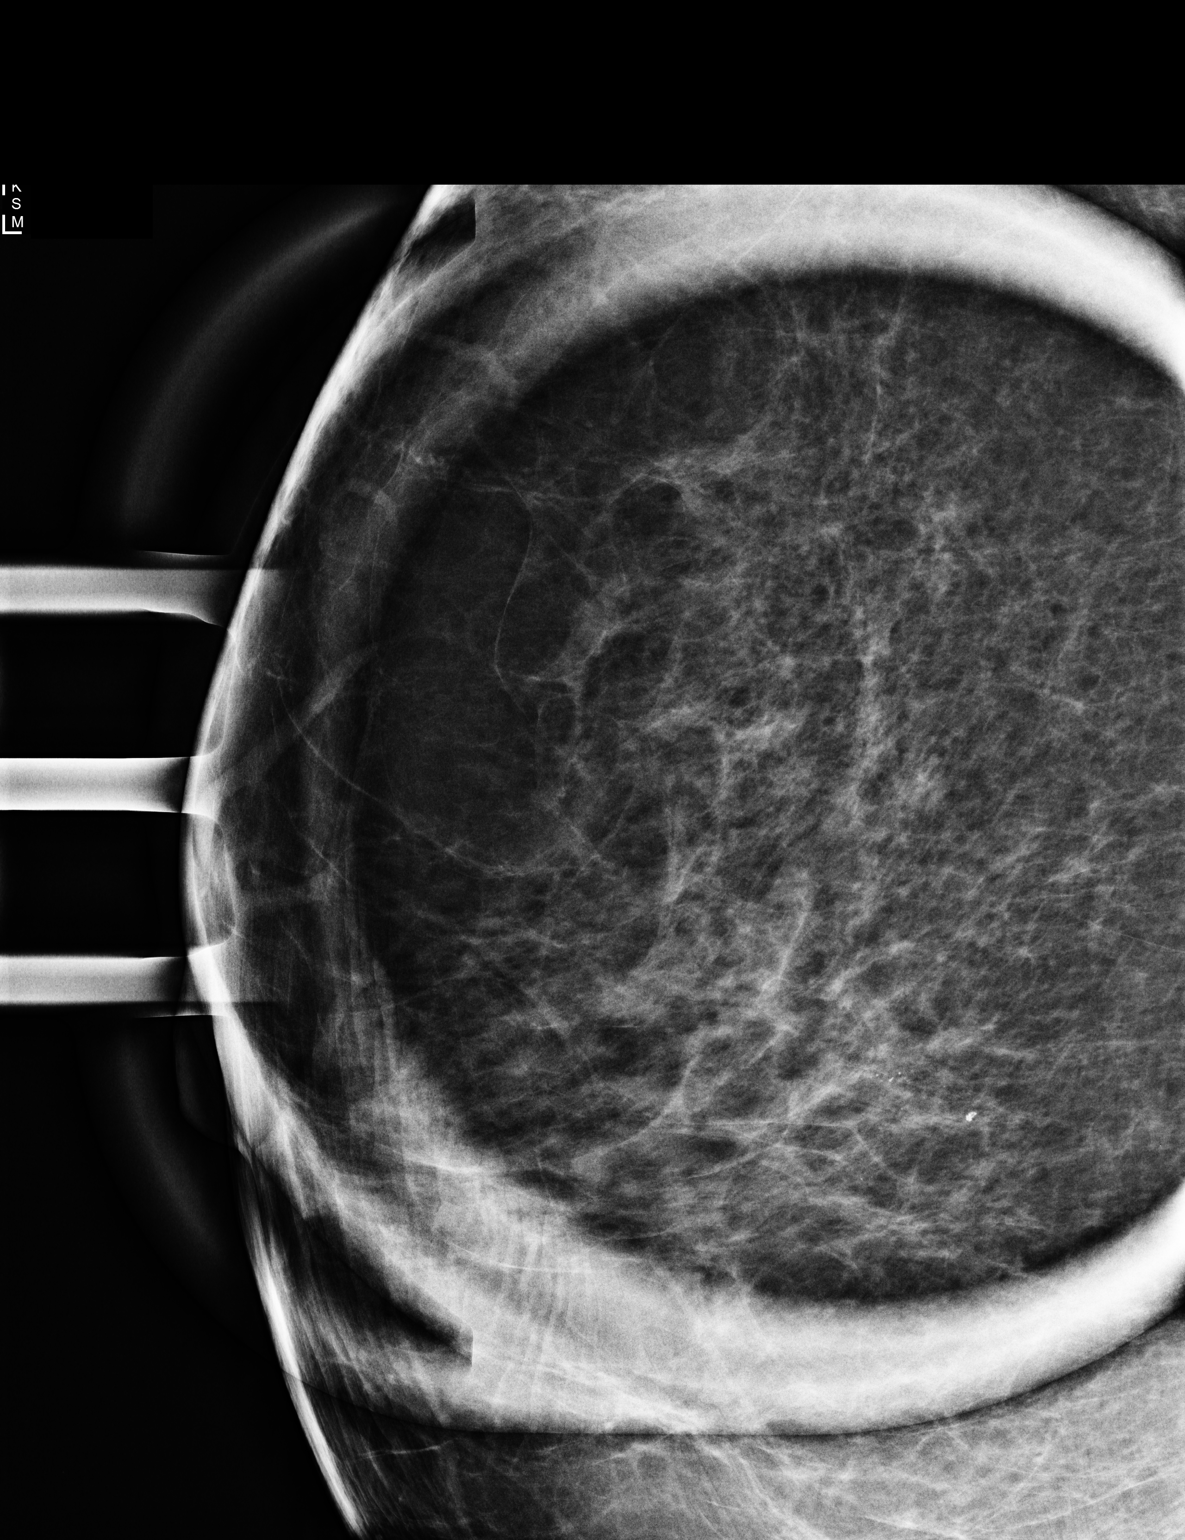

[3 of 3 positions shown; findings below may reference images not displayed]

ACR Breast Density Category b: There are scattered areas of
fibroglandular density.
FINDINGS: Spot magnification CC and lateral views of the right breast are
submitted. There is a group of calcifications in the lateral slight
lower right breast which are benign appearing and unchanged compared
to prior mammogram of 3903.
IMPRESSION: Benign findings.

RECOMMENDATION:
Routine screening mammogram back on schedule.

I have discussed the findings and recommendations with the patient.
Results were also provided in writing at the conclusion of the
visit. If applicable, a reminder letter will be sent to the patient
regarding the next appointment.

BI-RADS CATEGORY  2: Benign.

## 2019-04-06 ENCOUNTER — Encounter: Payer: BC Managed Care – PPO | Admitting: Internal Medicine

## 2019-04-09 ENCOUNTER — Encounter: Payer: Self-pay | Admitting: Internal Medicine

## 2019-04-09 ENCOUNTER — Other Ambulatory Visit: Payer: Self-pay

## 2019-04-09 ENCOUNTER — Ambulatory Visit (INDEPENDENT_AMBULATORY_CARE_PROVIDER_SITE_OTHER): Payer: BC Managed Care – PPO | Admitting: Internal Medicine

## 2019-04-09 VITALS — BP 118/64 | HR 95 | Temp 98.8°F | Ht 63.5 in | Wt 161.8 lb

## 2019-04-09 DIAGNOSIS — Z0000 Encounter for general adult medical examination without abnormal findings: Secondary | ICD-10-CM | POA: Diagnosis not present

## 2019-04-09 DIAGNOSIS — Z716 Tobacco abuse counseling: Secondary | ICD-10-CM

## 2019-04-09 DIAGNOSIS — M79641 Pain in right hand: Secondary | ICD-10-CM

## 2019-04-09 DIAGNOSIS — M79642 Pain in left hand: Secondary | ICD-10-CM | POA: Diagnosis not present

## 2019-04-09 DIAGNOSIS — I10 Essential (primary) hypertension: Secondary | ICD-10-CM

## 2019-04-09 LAB — POCT URINALYSIS DIPSTICK
Blood, UA: NEGATIVE
Glucose, UA: NEGATIVE
Ketones, UA: NEGATIVE
Nitrite, UA: NEGATIVE
Protein, UA: NEGATIVE
Spec Grav, UA: 1.025 (ref 1.010–1.025)
Urobilinogen, UA: 1 E.U./dL
pH, UA: 6 (ref 5.0–8.0)

## 2019-04-09 LAB — POCT UA - MICROALBUMIN
Albumin/Creatinine Ratio, Urine, POC: 30
Creatinine, POC: 300 mg/dL
Microalbumin Ur, POC: 30 mg/L

## 2019-04-09 MED ORDER — TRIBENZOR 40-5-25 MG PO TABS: 1.0000 | ORAL_TABLET | Freq: Every day | ORAL | 2 refills | Status: DC

## 2019-04-09 NOTE — Progress Notes (Signed)
Subjective:     Patient ID: Samantha Gomez , female    DOB: Jun 30, 1955 , 63 y.o.   MRN: [id number]   Chief Complaint  Patient presents with  . Annual Exam  . Hypertension    HPI  She is here today for a full physical examination.  Her last pap smear was performed in 2018.   Hypertension This is a chronic problem. The current episode started more than 1 year ago. The problem is controlled. Pertinent negatives include no blurred vision, chest pain, palpitations or shortness of breath. Risk factors for coronary artery disease include smoking/tobacco exposure, stress and post-menopausal state.     Past Medical History:  Diagnosis Date  . Benign hypertensive heart disease   . Chronic kidney disease   . Disorder of nasal cavity   . Hypertension      Family History  Problem Relation Age of Onset  . Dementia Mother   . Lung cancer Father      Current Outpatient Medications:  .  b complex vitamins tablet, Take 1 tablet by mouth daily., Disp: , Rfl:  .  Cholecalciferol (VITAMIN D-3) 5000 units TABS, Take by mouth daily., Disp: , Rfl:  .  niacin 250 MG tablet, Take 250 mg by mouth daily., Disp: , Rfl:  .  TRIBENZOR 40-5-25 MG TABS, Take 1 tablet by mouth daily., Disp: 90 tablet, Rfl: 2   No Known Allergies    The patient states she uses none for birth control. Last LMP was No LMP recorded. Patient is postmenopausal.. Negative for Dysmenorrhea  Negative for: breast discharge, breast lump(s), breast pain and breast self exam. Associated symptoms include abnormal vaginal bleeding. Pertinent negatives include abnormal bleeding (hematology), anxiety, decreased libido, depression, difficulty falling sleep, dyspareunia, history of infertility, nocturia, sexual dysfunction, sleep disturbances, urinary incontinence, urinary urgency, vaginal discharge and vaginal itching. Diet regular.The patient states her exercise level is  intermittent.   . The patient's tobacco use is:  Social History    Tobacco Use  Smoking Status Light Tobacco Smoker  . Packs/day: 0.25  . Years: 44.00  . Pack years: 11.00  . Types: Cigarettes  Smokeless Tobacco Never Used  . She has been exposed to passive smoke. The patient's alcohol use is:  Social History   Substance and Sexual Activity  Alcohol Use Yes  . Alcohol/week: 2.0 standard drinks  . Types: 2 Shots of liquor per week    Review of Systems  Constitutional: Negative.   HENT: Negative.   Eyes: Negative.  Negative for blurred vision.  Respiratory: Negative.  Negative for shortness of breath.   Cardiovascular: Negative.  Negative for chest pain and palpitations.  Endocrine: Negative.   Genitourinary: Negative.   Musculoskeletal: Positive for arthralgias.       She c/o b/l hand pain. She has pain upon awakening in the mornings. Also with stiffness.  Denies fall/trauma. Sx have persisted over past several months. Sx described as dull, throbbing.   Skin: Negative.   Allergic/Immunologic: Negative.   Neurological: Negative.   Hematological: Negative.   Psychiatric/Behavioral: Negative.      Today's Vitals   04/09/19 1109  BP: 118/64  Pulse: 95  Temp: 98.8 F (37.1 C)  TempSrc: Oral  Weight: 161 lb 12.8 oz (73.4 kg)  Height: 5' 3.5" (1.613 m)   Body mass index is 28.21 kg/m.   Objective:  Physical Exam Vitals signs and nursing note reviewed.  Constitutional:      Appearance: Normal appearance.  HENT:  Head: Normocephalic and atraumatic.     Right Ear: Tympanic membrane, ear canal and external ear normal.     Left Ear: Tympanic membrane, ear canal and external ear normal.     Nose: Nose normal.     Mouth/Throat:     Mouth: Mucous membranes are moist.     Pharynx: Oropharynx is clear.  Eyes:     Extraocular Movements: Extraocular movements intact.     Conjunctiva/sclera: Conjunctivae normal.     Pupils: Pupils are equal, round, and reactive to light.  Neck:     Musculoskeletal: Normal range of motion and neck  supple.  Cardiovascular:     Rate and Rhythm: Normal rate and regular rhythm.     Pulses: Normal pulses.     Heart sounds: Normal heart sounds.  Pulmonary:     Effort: Pulmonary effort is normal.     Breath sounds: Normal breath sounds.  Chest:     Breasts: Tanner Score is 5.        Right: Normal.        Left: Normal.  Abdominal:     General: Abdomen is flat. Bowel sounds are normal.     Palpations: Abdomen is soft.  Genitourinary:    Comments: deferred Musculoskeletal: Normal range of motion.  Skin:    General: Skin is warm and dry.  Neurological:     General: No focal deficit present.     Mental Status: She is alert and oriented to person, place, and time.  Psychiatric:        Mood and Affect: Mood normal.        Behavior: Behavior normal.         Assessment And Plan:     1. Routine general medical examination at health care facility  A full exam was performed.  Importance of monthly self breast exams was discussed with the patient. PATIENT HAS BEEN ADVISED TO GET 30-45 MINUTES REGULAR EXERCISE NO LESS THAN FOUR TO FIVE DAYS PER WEEK - BOTH WEIGHTBEARING EXERCISES AND AEROBIC ARE RECOMMENDED.  SHE WAS ADVISED TO FOLLOW A HEALTHY DIET WITH AT LEAST SIX FRUITS/VEGGIES PER DAY, DECREASE INTAKE OF RED MEAT, AND TO INCREASE FISH INTAKE TO TWO DAYS PER WEEK.  MEATS/FISH SHOULD NOT BE FRIED, BAKED OR BROILED IS PREFERABLE.  I SUGGEST WEARING SPF 50 SUNSCREEN ON EXPOSED PARTS AND ESPECIALLY WHEN IN THE DIRECT SUNLIGHT FOR AN EXTENDED PERIOD OF TIME.  PLEASE AVOID FAST FOOD RESTAURANTS AND INCREASE YOUR WATER INTAKE.  - CMP14+EGFR - CBC - TSH  2. Essential hypertension, benign  Chronic, well controlled. She will continue with current meds. She is encouraged to avoid adding salt to her foods. EKG performed, no new changes noted. She will rto in six months for re-evaluation.   - EKG 12-Lead - POCT Urinalysis Dipstick (81002) - POCT UA - Microalbumin  3. Bilateral hand pain  I  will check labs as listed. I will make further recommendations once her labs are available for review.   - ANA, IFA (with reflex) - CYCLIC CITRUL PEPTIDE ANTIBODY, IGG/IGA - Rheumatoid factor - Sedimentation rate - Uric acid  4. Tobacco abuse counseling  Smoking cessation instruction/counseling given:  counseled patient on the dangers of tobacco use, advised patient to stop smoking, and reviewed strategies to maximize success         Maximino Greenland, MD    THE PATIENT IS ENCOURAGED TO PRACTICE SOCIAL DISTANCING DUE TO THE COVID-19 PANDEMIC.

## 2019-04-09 NOTE — Patient Instructions (Signed)
Health Maintenance, Female Adopting a healthy lifestyle and getting preventive care are important in promoting health and wellness. Ask your health care provider about:  The right schedule for you to have regular tests and exams.  Things you can do on your own to prevent diseases and keep yourself healthy. What should I know about diet, weight, and exercise? Eat a healthy diet   Eat a diet that includes plenty of vegetables, fruits, low-fat dairy products, and lean protein.  Do not eat a lot of foods that are high in solid fats, added sugars, or sodium. Maintain a healthy weight Body mass index (BMI) is used to identify weight problems. It estimates body fat based on height and weight. Your health care provider can help determine your BMI and help you achieve or maintain a healthy weight. Get regular exercise Get regular exercise. This is one of the most important things you can do for your health. Most adults should:  Exercise for at least 150 minutes each week. The exercise should increase your heart rate and make you sweat (moderate-intensity exercise).  Do strengthening exercises at least twice a week. This is in addition to the moderate-intensity exercise.  Spend less time sitting. Even light physical activity can be beneficial. Watch cholesterol and blood lipids Have your blood tested for lipids and cholesterol at 64 years of age, then have this test every 5 years. Have your cholesterol levels checked more often if:  Your lipid or cholesterol levels are high.  You are older than 64 years of age.  You are at high risk for heart disease. What should I know about cancer screening? Depending on your health history and family history, you may need to have cancer screening at various ages. This may include screening for:  Breast cancer.  Cervical cancer.  Colorectal cancer.  Skin cancer.  Lung cancer. What should I know about heart disease, diabetes, and high blood  pressure? Blood pressure and heart disease  High blood pressure causes heart disease and increases the risk of stroke. This is more likely to develop in people who have high blood pressure readings, are of African descent, or are overweight.  Have your blood pressure checked: ? Every 3-5 years if you are 18-39 years of age. ? Every year if you are 40 years old or older. Diabetes Have regular diabetes screenings. This checks your fasting blood sugar level. Have the screening done:  Once every three years after age 40 if you are at a normal weight and have a low risk for diabetes.  More often and at a younger age if you are overweight or have a high risk for diabetes. What should I know about preventing infection? Hepatitis B If you have a higher risk for hepatitis B, you should be screened for this virus. Talk with your health care provider to find out if you are at risk for hepatitis B infection. Hepatitis C Testing is recommended for:  Everyone born from 1945 through 1965.  Anyone with known risk factors for hepatitis C. Sexually transmitted infections (STIs)  Get screened for STIs, including gonorrhea and chlamydia, if: ? You are sexually active and are younger than 64 years of age. ? You are older than 64 years of age and your health care provider tells you that you are at risk for this type of infection. ? Your sexual activity has changed since you were last screened, and you are at increased risk for chlamydia or gonorrhea. Ask your health care provider if   you are at risk.  Ask your health care provider about whether you are at high risk for HIV. Your health care provider may recommend a prescription medicine to help prevent HIV infection. If you choose to take medicine to prevent HIV, you should first get tested for HIV. You should then be tested every 3 months for as long as you are taking the medicine. Pregnancy  If you are about to stop having your period (premenopausal) and  you may become pregnant, seek counseling before you get pregnant.  Take 400 to 800 micrograms (mcg) of folic acid every day if you become pregnant.  Ask for birth control (contraception) if you want to prevent pregnancy. Osteoporosis and menopause Osteoporosis is a disease in which the bones lose minerals and strength with aging. This can result in bone fractures. If you are 65 years old or older, or if you are at risk for osteoporosis and fractures, ask your health care provider if you should:  Be screened for bone loss.  Take a calcium or vitamin D supplement to lower your risk of fractures.  Be given hormone replacement therapy (HRT) to treat symptoms of menopause. Follow these instructions at home: Lifestyle  Do not use any products that contain nicotine or tobacco, such as cigarettes, e-cigarettes, and chewing tobacco. If you need help quitting, ask your health care provider.  Do not use street drugs.  Do not share needles.  Ask your health care provider for help if you need support or information about quitting drugs. Alcohol use  Do not drink alcohol if: ? Your health care provider tells you not to drink. ? You are pregnant, may be pregnant, or are planning to become pregnant.  If you drink alcohol: ? Limit how much you use to 0-1 drink a day. ? Limit intake if you are breastfeeding.  Be aware of how much alcohol is in your drink. In the [place] one drink equals one 12 oz bottle of beer (355 mL), one 5 oz glass of wine (148 mL), or one 1 oz glass of hard liquor (44 mL). General instructions  Schedule regular health, dental, and eye exams.  Stay current with your vaccines.  Tell your health care provider if: ? You often feel depressed. ? You have ever been abused or do not feel safe at home. Summary  Adopting a healthy lifestyle and getting preventive care are important in promoting health and wellness.  Follow your health care provider's instructions about healthy  diet, exercising, and getting tested or screened for diseases.  Follow your health care provider's instructions on monitoring your cholesterol and blood pressure. This information is not intended to replace advice given to you by your health care provider. Make sure you discuss any questions you have with your health care provider. Document Released: 11/22/2010 Document Revised: 05/02/2018 Document Reviewed: 05/02/2018 Elsevier Patient Education  2020 Elsevier Inc.  

## 2019-04-14 LAB — CMP14+EGFR
ALT: 19 IU/L (ref 0–32)
AST: 20 IU/L (ref 0–40)
Albumin/Globulin Ratio: 2.2 (ref 1.2–2.2)
Albumin: 4.4 g/dL (ref 3.8–4.8)
Alkaline Phosphatase: 74 IU/L (ref 39–117)
BUN/Creatinine Ratio: 19 (ref 12–28)
BUN: 19 mg/dL (ref 8–27)
Bilirubin Total: 0.4 mg/dL (ref 0.0–1.2)
CO2: 26 mmol/L (ref 20–29)
Calcium: 9.5 mg/dL (ref 8.7–10.3)
Chloride: 100 mmol/L (ref 96–106)
Creatinine, Ser: 1.01 mg/dL — ABNORMAL HIGH (ref 0.57–1.00)
GFR calc Af Amer: 68 mL/min/{1.73_m2} (ref >59)
GFR calc non Af Amer: 59 mL/min/{1.73_m2} — ABNORMAL LOW (ref >59)
Globulin, Total: 2 g/dL (ref 1.5–4.5)
Glucose: 112 mg/dL — ABNORMAL HIGH (ref 65–99)
Potassium: 3.9 mmol/L (ref 3.5–5.2)
Sodium: 141 mmol/L (ref 134–144)
Total Protein: 6.4 g/dL (ref 6.0–8.5)

## 2019-04-14 LAB — RHEUMATOID FACTOR: Rheumatoid fact SerPl-aCnc: <10 IU/mL (ref 0.0–13.9)

## 2019-04-14 LAB — CYCLIC CITRUL PEPTIDE ANTIBODY, IGG/IGA: Cyclic Citrullin Peptide Ab: 4 units (ref 0–19)

## 2019-04-14 LAB — CBC
Hematocrit: 41.1 % (ref 34.0–46.6)
Hemoglobin: 14 g/dL (ref 11.1–15.9)
MCH: 28.7 pg (ref 26.6–33.0)
MCHC: 34.1 g/dL (ref 31.5–35.7)
MCV: 84 fL (ref 79–97)
Platelets: 225 10*3/uL (ref 150–450)
RBC: 4.87 x10E6/uL (ref 3.77–5.28)
RDW: 12.5 % (ref 11.7–15.4)
WBC: 4.7 10*3/uL (ref 3.4–10.8)

## 2019-04-14 LAB — URIC ACID: Uric Acid: 4.5 mg/dL (ref 2.5–7.1)

## 2019-04-14 LAB — ANTINUCLEAR ANTIBODIES, IFA: ANA Titer 1: NEGATIVE

## 2019-04-14 LAB — SEDIMENTATION RATE: Sed Rate: 18 mm/hr (ref 0–40)

## 2019-04-14 LAB — TSH: TSH: 0.809 u[IU]/mL (ref 0.450–4.500)

## 2019-04-16 ENCOUNTER — Telehealth: Payer: Self-pay

## 2019-04-16 NOTE — Telephone Encounter (Signed)
Called pt to see if she wanted Tribenzor pt VM was full

## 2019-04-22 ENCOUNTER — Telehealth: Payer: Self-pay

## 2019-04-22 NOTE — Telephone Encounter (Signed)
Voicemail full  I was calling the pt to see if she prefers the brand name of Tribenzor or the generic.

## 2019-04-23 ENCOUNTER — Telehealth: Payer: Self-pay

## 2019-04-23 NOTE — Telephone Encounter (Signed)
The pt said yes she would like the brand name of tribenzor.

## 2019-10-08 ENCOUNTER — Ambulatory Visit (INDEPENDENT_AMBULATORY_CARE_PROVIDER_SITE_OTHER): Payer: BC Managed Care – PPO | Admitting: Internal Medicine

## 2019-10-08 ENCOUNTER — Other Ambulatory Visit: Payer: Self-pay

## 2019-10-08 ENCOUNTER — Encounter: Payer: Self-pay | Admitting: Internal Medicine

## 2019-10-08 VITALS — BP 112/70 | HR 75 | Temp 98.5°F | Ht 63.5 in | Wt 163.2 lb

## 2019-10-08 DIAGNOSIS — E663 Overweight: Secondary | ICD-10-CM | POA: Diagnosis not present

## 2019-10-08 DIAGNOSIS — Z716 Tobacco abuse counseling: Secondary | ICD-10-CM

## 2019-10-08 DIAGNOSIS — I10 Essential (primary) hypertension: Secondary | ICD-10-CM

## 2019-10-08 DIAGNOSIS — Z6828 Body mass index (BMI) 28.0-28.9, adult: Secondary | ICD-10-CM | POA: Diagnosis not present

## 2019-10-08 MED ORDER — TRIBENZOR 40-5-25 MG PO TABS: 1.0000 | ORAL_TABLET | Freq: Every day | ORAL | 2 refills | Status: DC

## 2019-10-08 NOTE — Patient Instructions (Signed)
Lung Cancer Screening A lung cancer screening is a test that checks for lung cancer. Lung cancer screening is done to look for lung cancer in its very early stages, before it spreads and becomes harder to treat and before symptoms appear. Finding cancer early improves the chances of successful treatment. It may save your life. Should I be screened for lung cancer? You should be screened for lung cancer if all of these apply:  You currently smoke or you have quit smoking within the past 15 years.  You are 55-74 years old. Screening may be recommended up to age 80 depending on your overall health and other factors.  You are in good general health.  You have a smoking history of 1 pack a day for 30 years or 2 packs a day for 15 years. Screening may also be recommended if you are at high risk for the disease. You may be at high risk if:  You have a family history of lung cancer.  You have been exposed to asbestos.  You have chronic obstructive pulmonary disease (COPD).  You have a history of previous lung cancer. How often should I be screened for lung cancer?  If you are at risk for lung cancer, it is recommended that you are screened once a year. The recommended screening test is a low-dose CT scan. How can I lower my risk of lung cancer? To lower your risk of developing lung cancer:  If you smoke, stop smoking all tobacco products.  Avoid secondhand smoke.  Avoid exposure to radiation.  Avoid exposure to radon gas. Have your home checked for radon regularly.  Avoid things that cause cancer (carcinogens).  Avoid living or working in places with high air pollution. Where to find more information Ask your health care provider about the risks and benefits of screening. More information and resources are available from these organizations:  American Cancer Society (ACS): www.cancer.org  American Lung Association: www.lung.org Contact a health care provider if:  You start to  show symptoms of lung cancer, including: ? Coughing that will not go away. ? Wheezing. ? Chest pain. ? Coughing up blood. ? Shortness of breath. ? Weight loss that cannot be explained. ? Constant fatigue. Summary  Lung cancer screening may find lung cancer before symptoms appear. Finding cancer early improves the chances of successful treatment. It may save your life.  If you are at risk for lung cancer, it is recommended that you are screened once a year. The recommended screening test is a low-dose CT scan.  You can make lifestyle changes to lower your risk of lung cancer.  Ask your health care provider about the risks and benefits of screening. This information is not intended to replace advice given to you by your health care provider. Make sure you discuss any questions you have with your health care provider. Document Revised: 08/31/2018 Document Reviewed: 03/30/2016 Elsevier Patient Education  2020 Elsevier Inc.  

## 2019-10-08 NOTE — Progress Notes (Signed)
This visit occurred during the SARS-CoV-2 public health emergency.  Safety protocols were in place, including screening questions prior to the visit, additional usage of staff PPE, and extensive cleaning of exam room while observing appropriate contact time as indicated for disinfecting solutions.  Subjective:     Patient ID: Samantha Gomez , female    DOB: 1955-09-19 , 64 y.o.   MRN: [id number]   Chief Complaint  Patient presents with  . Hypertension    HPI  She is here today for bp check. She has been following social distancing/stay at home orders. She has been vaccinated.   Hypertension This is a chronic problem. The current episode started more than 1 year ago. The problem is controlled. Pertinent negatives include no blurred vision, chest pain, palpitations or shortness of breath. Risk factors for coronary artery disease include smoking/tobacco exposure, stress and post-menopausal state. The current treatment provides moderate improvement. Compliance problems include exercise.      Past Medical History:  Diagnosis Date  . Benign hypertensive heart disease   . Chronic kidney disease   . Disorder of nasal cavity   . Hypertension      Family History  Problem Relation Age of Onset  . Dementia Mother   . Lung cancer Father      Current Outpatient Medications:  .  b complex vitamins tablet, Take 1 tablet by mouth daily., Disp: , Rfl:  .  Cholecalciferol (VITAMIN D-3) 5000 units TABS, Take by mouth daily., Disp: , Rfl:  .  niacin 250 MG tablet, Take 250 mg by mouth daily., Disp: , Rfl:  .  TRIBENZOR 40-5-25 MG TABS, Take 1 tablet by mouth daily., Disp: 90 tablet, Rfl: 2   No Known Allergies   Review of Systems  Constitutional: Negative.   Eyes: Negative for blurred vision.  Respiratory: Negative.  Negative for shortness of breath.   Cardiovascular: Negative.  Negative for chest pain and palpitations.  Gastrointestinal: Negative.   Neurological: Negative.    Psychiatric/Behavioral: Negative.      Today's Vitals   10/08/19 0841  BP: 112/70  Pulse: 75  Temp: 98.5 F (36.9 C)  TempSrc: Oral  Weight: 163 lb 3.2 oz (74 kg)  Height: 5' 3.5" (1.613 m)   Body mass index is 28.46 kg/m.   Objective:  Physical Exam Vitals and nursing note reviewed.  Constitutional:      Appearance: Normal appearance.  HENT:     Head: Normocephalic and atraumatic.  Cardiovascular:     Rate and Rhythm: Normal rate and regular rhythm.     Heart sounds: Normal heart sounds.  Pulmonary:     Effort: Pulmonary effort is normal.     Breath sounds: Normal breath sounds.  Skin:    General: Skin is warm.  Neurological:     General: No focal deficit present.     Mental Status: She is alert.  Psychiatric:        Mood and Affect: Mood normal.        Behavior: Behavior normal.         Assessment And Plan:     1. Essential hypertension, benign  Chronic, well controlled. She will continue with current meds. I will check labs as listed below. She will rto in six months for her Welcome to Medicare AWV.   - CMP14+EGFR - Lipid panel  2. Tobacco abuse counseling  Smoking cessation instruction/counseling given:  counseled patient on the dangers of tobacco use, advised patient to stop smoking, and reviewed strategies  to maximize success We discussed the importance of tobacco cessation for greater than 3 minutes.  She also declined low dose CT scan, she has greater than 30-pack year history of tobacco use.  She will let me know if she changes her mind.   3. Overweight with body mass index (BMI) of 28 to 28.9 in adult  She is encouraged to strive for BMI less than 26 to decrease cardiac risk. Advised to exercise at least 150 minutes per week.   Maximino Greenland, MD    THE PATIENT IS ENCOURAGED TO PRACTICE SOCIAL DISTANCING DUE TO THE COVID-19 PANDEMIC.

## 2019-10-09 LAB — CMP14+EGFR
ALT: 25 IU/L (ref 0–32)
AST: 25 IU/L (ref 0–40)
Albumin/Globulin Ratio: 2.2 (ref 1.2–2.2)
Albumin: 4.3 g/dL (ref 3.8–4.8)
Alkaline Phosphatase: 86 IU/L (ref 48–121)
BUN/Creatinine Ratio: 17 (ref 12–28)
BUN: 17 mg/dL (ref 8–27)
Bilirubin Total: 0.2 mg/dL (ref 0.0–1.2)
CO2: 24 mmol/L (ref 20–29)
Calcium: 9.4 mg/dL (ref 8.7–10.3)
Chloride: 99 mmol/L (ref 96–106)
Creatinine, Ser: 0.99 mg/dL (ref 0.57–1.00)
GFR calc Af Amer: 70 mL/min/{1.73_m2} (ref >59)
GFR calc non Af Amer: 60 mL/min/{1.73_m2} (ref >59)
Globulin, Total: 2 g/dL (ref 1.5–4.5)
Glucose: 104 mg/dL — ABNORMAL HIGH (ref 65–99)
Potassium: 3.9 mmol/L (ref 3.5–5.2)
Sodium: 139 mmol/L (ref 134–144)
Total Protein: 6.3 g/dL (ref 6.0–8.5)

## 2019-10-09 LAB — LIPID PANEL
Chol/HDL Ratio: 2 ratio (ref 0.0–4.4)
Cholesterol, Total: 203 mg/dL — ABNORMAL HIGH (ref 100–199)
HDL: 100 mg/dL (ref >39)
LDL Chol Calc (NIH): 95 mg/dL (ref 0–99)
Triglycerides: 44 mg/dL (ref 0–149)
VLDL Cholesterol Cal: 8 mg/dL (ref 5–40)

## 2019-10-15 ENCOUNTER — Encounter: Payer: Self-pay | Admitting: Internal Medicine

## 2020-01-08 ENCOUNTER — Telehealth: Payer: Self-pay

## 2020-01-08 NOTE — Telephone Encounter (Signed)
The pt was asked what was the reason that she prefers the Brand Tribenzor over the generic and the pt said that the generic tribenzor causes her to stay dizzy.

## 2020-01-09 LAB — HM MAMMOGRAPHY: HM Mammogram: ABNORMAL — AB (ref 0–4)

## 2020-01-10 ENCOUNTER — Other Ambulatory Visit: Payer: Self-pay

## 2020-01-10 NOTE — Telephone Encounter (Signed)
Patient notified that her tribenzor has been approved for coverage by her insurance until 05/22/2020.

## 2020-01-14 ENCOUNTER — Encounter: Payer: Self-pay | Admitting: Internal Medicine

## 2020-01-23 LAB — HM MAMMOGRAPHY

## 2020-01-24 ENCOUNTER — Encounter: Payer: Self-pay | Admitting: Internal Medicine

## 2020-04-27 IMAGING — MG DIGITAL SCREENING BILATERAL MAMMOGRAM WITH CAD
4 series · 4 of 4 positions shown · non-contrast
Comparison: Previous exam(s).

CLINICAL DATA: Screening.

EXAM:
DIGITAL SCREENING BILATERAL MAMMOGRAM WITH CAD

[R CC]
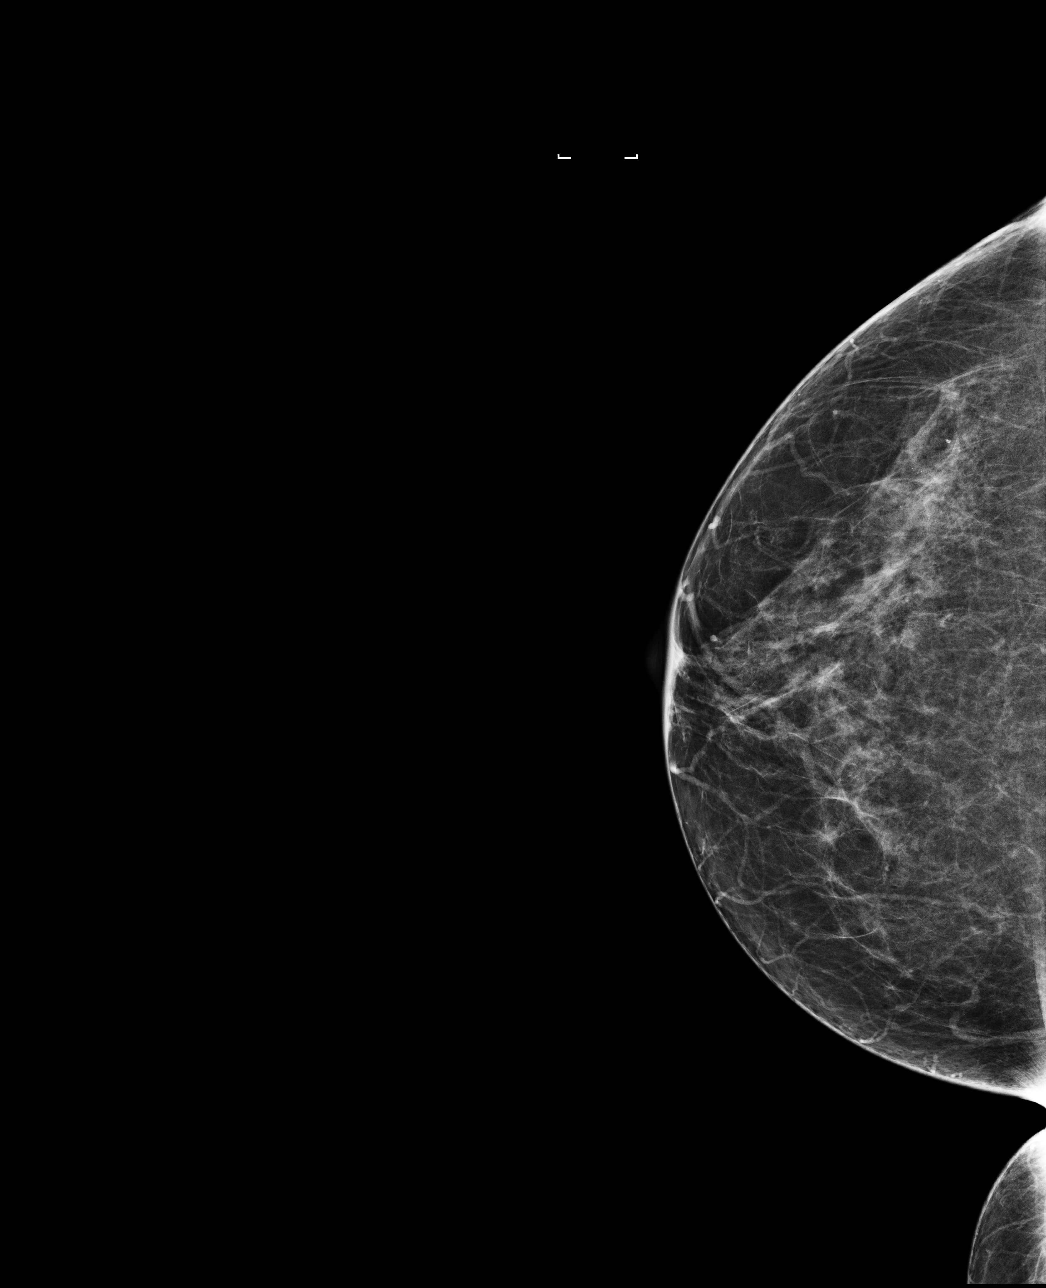

[R MLO]
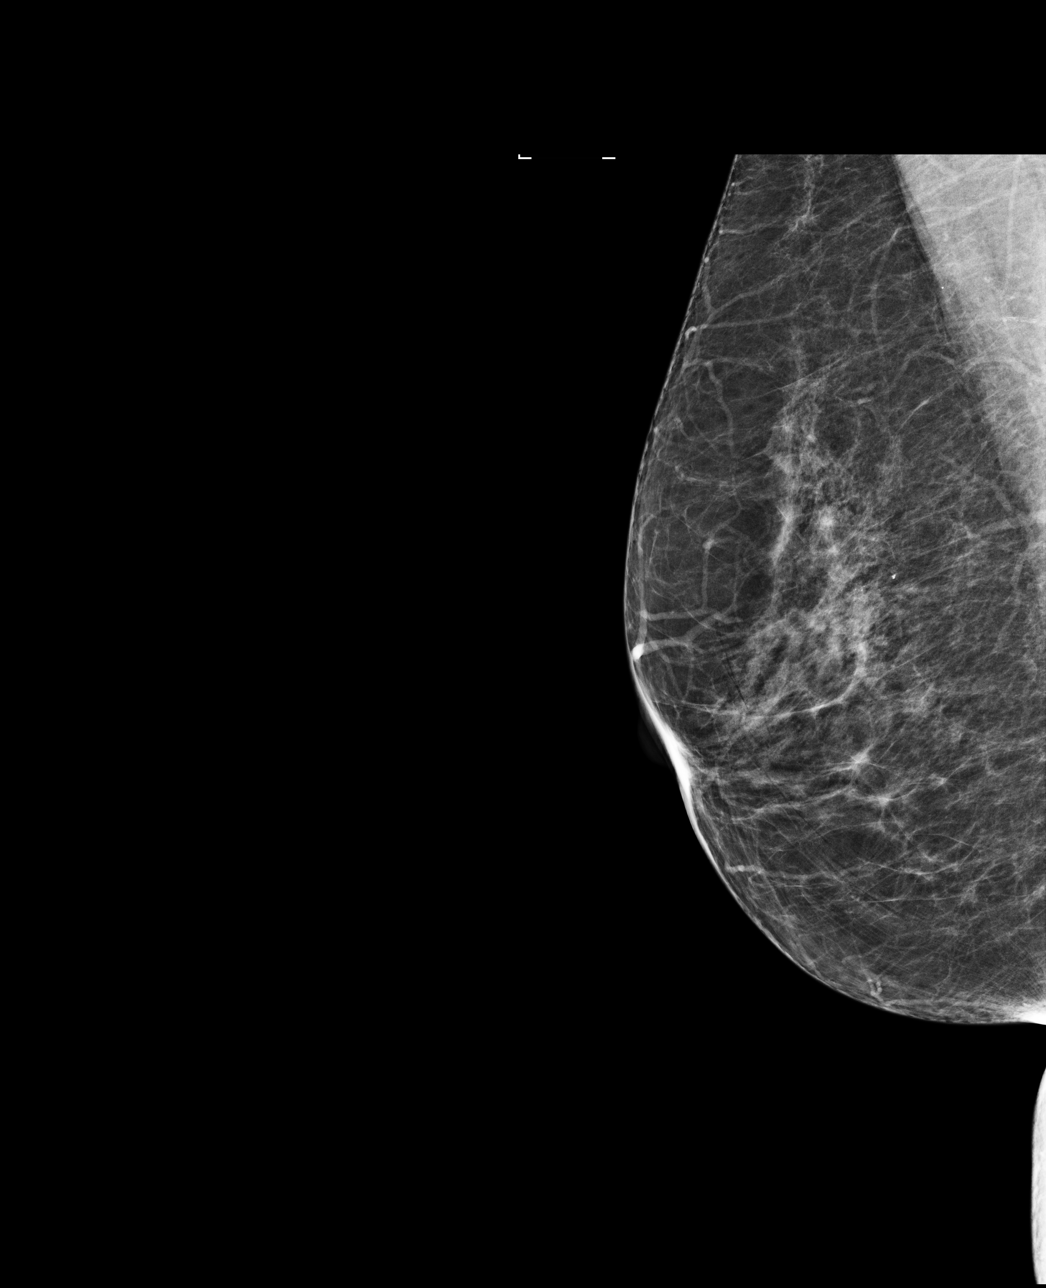

[L CC]
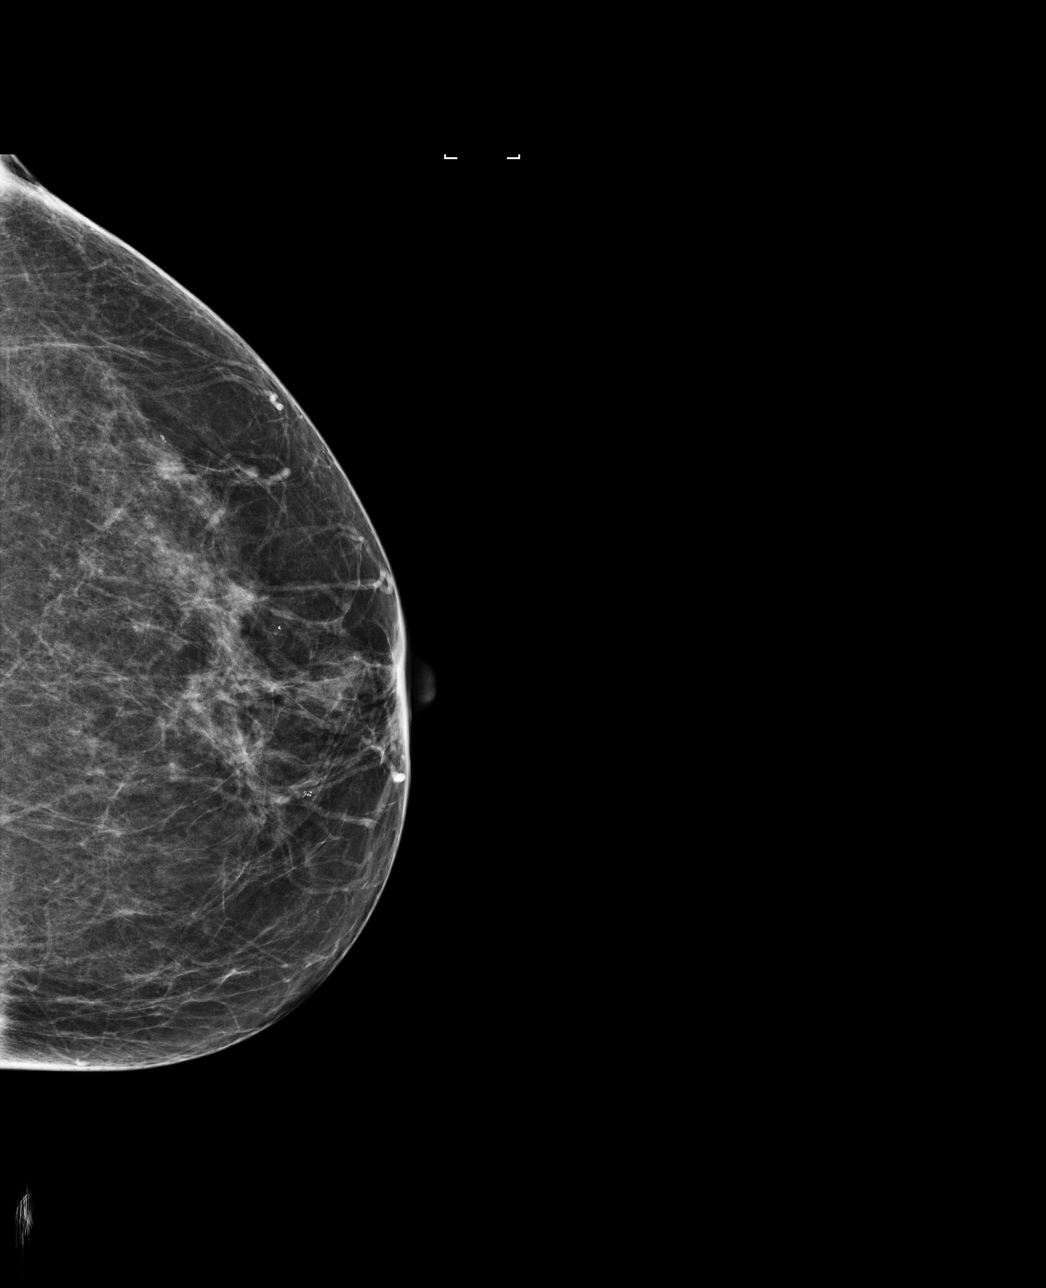

[L MLO]
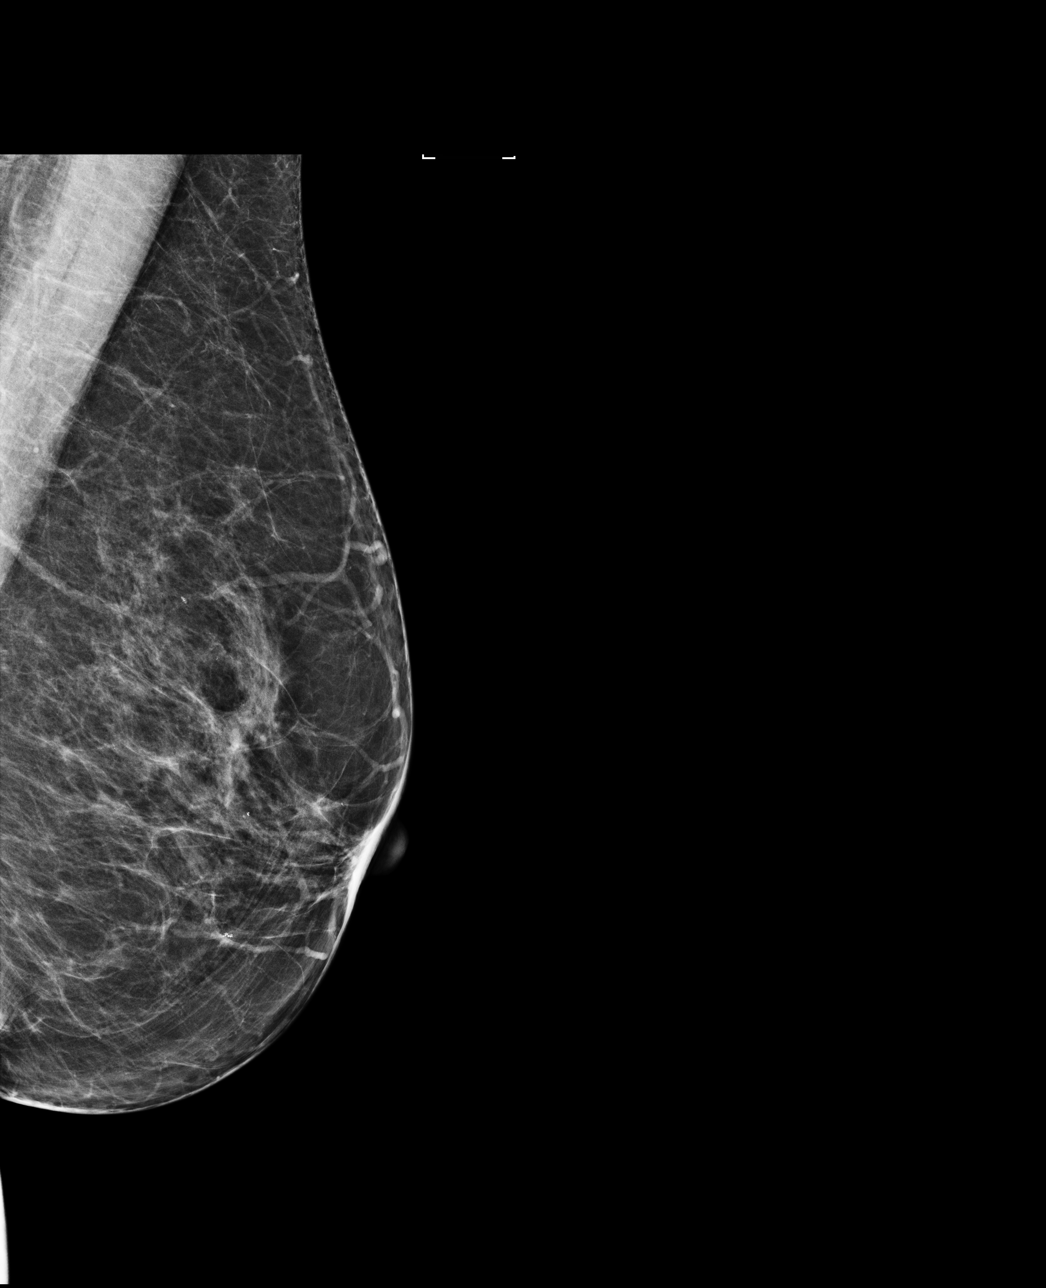

[4 of 4 positions shown; findings below may reference images not displayed]

ACR Breast Density Category b: There are scattered areas of
fibroglandular density.
FINDINGS: There are no findings suspicious for malignancy. Images were
processed with CAD.
IMPRESSION: No mammographic evidence of malignancy. A result letter of this
screening mammogram will be mailed directly to the patient.

RECOMMENDATION:
Screening mammogram in one year. (Code:AS-G-LCT)

BI-RADS CATEGORY  1: Negative.

## 2020-04-28 ENCOUNTER — Ambulatory Visit (INDEPENDENT_AMBULATORY_CARE_PROVIDER_SITE_OTHER): Payer: Medicare PPO | Admitting: Internal Medicine

## 2020-04-28 ENCOUNTER — Encounter: Payer: BC Managed Care – PPO | Admitting: Internal Medicine

## 2020-04-28 ENCOUNTER — Other Ambulatory Visit: Payer: Self-pay

## 2020-04-28 ENCOUNTER — Other Ambulatory Visit (HOSPITAL_COMMUNITY)
Admission: RE | Admit: 2020-04-28 | Discharge: 2020-04-28 | Disposition: A | Discharge status: Home / Self Care | Payer: Medicare PPO | Source: Ambulatory Visit | Attending: Internal Medicine | Admitting: Internal Medicine

## 2020-04-28 ENCOUNTER — Encounter: Payer: Self-pay | Admitting: Internal Medicine

## 2020-04-28 VITALS — BP 116/74 | HR 91 | Temp 98.7°F | Ht 64.0 in | Wt 159.2 lb

## 2020-04-28 DIAGNOSIS — Z01419 Encounter for gynecological examination (general) (routine) without abnormal findings: Secondary | ICD-10-CM

## 2020-04-28 DIAGNOSIS — Z0000 Encounter for general adult medical examination without abnormal findings: Secondary | ICD-10-CM | POA: Diagnosis not present

## 2020-04-28 DIAGNOSIS — Z716 Tobacco abuse counseling: Secondary | ICD-10-CM | POA: Diagnosis not present

## 2020-04-28 DIAGNOSIS — Z1231 Encounter for screening mammogram for malignant neoplasm of breast: Secondary | ICD-10-CM

## 2020-04-28 DIAGNOSIS — E663 Overweight: Secondary | ICD-10-CM | POA: Diagnosis not present

## 2020-04-28 DIAGNOSIS — I10 Essential (primary) hypertension: Secondary | ICD-10-CM

## 2020-04-28 LAB — POCT UA - MICROALBUMIN
Albumin/Creatinine Ratio, Urine, POC: <30
Creatinine, POC: 300 mg/dL
Microalbumin Ur, POC: 10 mg/L

## 2020-04-28 LAB — POCT URINALYSIS DIPSTICK
Bilirubin, UA: NEGATIVE
Blood, UA: NEGATIVE
Glucose, UA: NEGATIVE
Nitrite, UA: NEGATIVE
Protein, UA: NEGATIVE
Spec Grav, UA: 1.025 (ref 1.010–1.025)
Urobilinogen, UA: 0.2 E.U./dL
pH, UA: 6 (ref 5.0–8.0)

## 2020-04-28 LAB — POC HEMOCCULT BLD/STL (OFFICE/1-CARD/DIAGNOSTIC): Fecal Occult Blood, POC: NEGATIVE

## 2020-04-28 MED ORDER — TRIBENZOR 40-5-25 MG PO TABS: 1.0000 | ORAL_TABLET | Freq: Every day | ORAL | 2 refills | Status: DC

## 2020-04-28 NOTE — Patient Instructions (Signed)
  Ms. Kerchner , Thank you for taking time to come for your Medicare Wellness Visit. I appreciate your ongoing commitment to your health goals. Please review the following plan we discussed and let me know if I can assist you in the future.   These are the goals we discussed: Goals   None     This is a list of the screening recommended for you and due dates:  Health Maintenance  Topic Date Due  . HIV Screening  Never done  . Pneumonia vaccines (1 of 2 - PCV13) Never done  . Flu Shot  Never done  . Pap Smear  02/26/2020  . Mammogram  01/22/2022  . Tetanus Vaccine  09/23/2022  . Colon Cancer Screening  06/07/2025  . DEXA scan (bone density measurement)  Completed  . COVID-19 Vaccine  Completed  .  Hepatitis C: One time screening is recommended by Center for Disease Control  (CDC) for  adults born from [birth year] through 1966.   Completed

## 2020-04-28 NOTE — Progress Notes (Signed)
I,[name],acting as a Education administrator for Maximino Greenland, MD.,have documented all relevant documentation on the behalf of Maximino Greenland, MD,as directed by  Maximino Greenland, MD while in the presence of Maximino Greenland, MD.   Subjective:    Samantha Gomez is a 64 y.o. female who presents for a Welcome to Medicare exam. She reports compliance with meds. Denies headaches, chest pain and shortness of breath.   She is here today for bp check. She has been following social distancing/stay at home orders. She does not mind being at home. She loves to garden.   Hypertension This is a chronic problem. The current episode started more than 1 year ago. The problem is controlled. Pertinent negatives include no blurred vision, chest pain, palpitations or shortness of breath. Risk factors for coronary artery disease include smoking/tobacco exposure, stress and post-menopausal state.   Review of Systems Review of Systems  Constitutional: Negative.   HENT: Negative.   Eyes: Negative.  Negative for blurred vision.  Respiratory: Negative.  Negative for shortness of breath.   Cardiovascular: Negative.  Negative for chest pain and palpitations.  Gastrointestinal: Negative.   Genitourinary: Negative.   Musculoskeletal: Negative.   Skin: Negative.   Neurological: Negative.   Endo/Heme/Allergies: Negative.   Psychiatric/Behavioral: Negative.     Cardiac Risk Factors include: hypertension , postmenopausal, smoker, ethnicity.       Objective:    Today's Vitals   04/28/20 1128 04/28/20 1159  BP: 116/74   Pulse: 91   Temp: 98.7 F (37.1 C)   TempSrc: Oral   Weight: 159 lb 3.2 oz (72.2 kg)   Height: _0  (1.626 m)   PainSc: 3  2   PainLoc: Hand   Body mass index is 27.33 kg/m. Wt Readings from Last 3 Encounters:  04/28/20 159 lb 3.2 oz (72.2 kg)  10/08/19 163 lb 3.2 oz (74 kg)  04/09/19 161 lb 12.8 oz (73.4 kg)   Medications Outpatient Encounter Medications as of 04/28/2020  Medication  Sig  . b complex vitamins tablet Take 1 tablet by mouth daily.  . Cholecalciferol (VITAMIN D-3) 5000 units TABS Take by mouth daily.  . niacin 250 MG tablet Take 250 mg by mouth daily.  Jabier Gauss 40-5-25 MG TABS Take 1 tablet by mouth daily.  . [DISCONTINUED] TRIBENZOR 40-5-25 MG TABS Take 1 tablet by mouth daily.   No facility-administered encounter medications on file as of 04/28/2020.     History: Past Medical History:  Diagnosis Date  . Allergy   . Benign hypertensive heart disease   . Chronic kidney disease   . Disorder of nasal cavity   . Hypertension    History reviewed. No pertinent surgical history.  Family History  Problem Relation Age of Onset  . Dementia Mother   . Lung cancer Father    Social History   Occupational History  . Not on file  Tobacco Use  . Smoking status: Heavy Tobacco Smoker    Packs/day: 0.75    Years: 20.00    Pack years: 15.00    Types: Cigarettes    Start date: 10/07/1969  . Smokeless tobacco: Never Used  . Tobacco comment: 1ppdx20 years, plus 3/4ppdx 15   Vaping Use  . Vaping Use: Never used  Substance and Sexual Activity  . Alcohol use: Yes    Alcohol/week: 2.0 standard drinks    Types: 2 Shots of liquor per week  . Drug use: Never  . Sexual activity: Not Currently  Tobacco Counseling Ready to quit: No Counseling given: Yes Comment: 1ppdx20 years, plus 3/4ppdx 15    Immunizations and Health Maintenance Immunization History  Administered Date(s) Administered  . DTaP 09/22/2012  . PFIZER SARS-COV-2 Vaccination 08/22/2019, 09/19/2019   Health Maintenance Due  Topic Date Due  . COVID-19 Vaccine (3 - Booster for Pfizer series) 03/20/2020    Activities of Daily Living In your present state of health, do you have any difficulty performing the following activities: 04/28/2020  Hearing? N  Vision? N  Difficulty concentrating or making decisions? N  Walking or climbing stairs? N  Dressing or bathing? N  Doing errands,  shopping? N  Preparing Food and eating ? N  Using the Toilet? N  In the past six months, have you accidently leaked urine? N  Do you have problems with loss of bowel control? N  Managing your Medications? N  Managing your Finances? N  Housekeeping or managing your Housekeeping? N  Some recent data might be hidden    Physical Exam   Physical Exam Vitals and nursing note reviewed. Exam conducted with a chaperone present.  Constitutional:      Appearance: Normal appearance.  HENT:     Head: Normocephalic and atraumatic.     Right Ear: Tympanic membrane, ear canal and external ear normal. There is no impacted cerumen.     Left Ear: Tympanic membrane, ear canal and external ear normal. There is no impacted cerumen.     Nose:     Comments: Deferred, masked    Mouth/Throat:     Comments: Deferred, masked Eyes:     Extraocular Movements: Extraocular movements intact.     Conjunctiva/sclera: Conjunctivae normal.     Pupils: Pupils are equal, round, and reactive to light.  Cardiovascular:     Rate and Rhythm: Normal rate and regular rhythm.  Pulmonary:     Effort: Pulmonary effort is normal.  Chest:  Breasts:     Tanner Score is 5.     Right: Normal.     Left: Normal.    Abdominal:     General: Bowel sounds are normal.     Palpations: Abdomen is soft.  Genitourinary:    General: Normal vulva.     Exam position: Lithotomy position.     Vagina: Normal.     Cervix: Normal.     Uterus: Normal.      Adnexa: Right adnexa normal and left adnexa normal.     Rectum: Normal. Guaiac result negative.  Musculoskeletal:        General: Normal range of motion.     Cervical back: Normal range of motion.  Skin:    General: Skin is warm.  Neurological:     General: No focal deficit present.     Mental Status: She is alert and oriented to person, place, and time.  Psychiatric:        Mood and Affect: Mood and affect normal.        Behavior: Behavior normal.        Thought Content:  Thought content normal.        Judgment: Judgment normal.    (optional), or other factors deemed appropriate based on the beneficiary's medical and social history and current clinical standards.  Advanced Directives: Does Patient Have a Medical Advance Directive?: No Would patient like information on creating a medical advance directive?: Yes (MAU/Ambulatory/Procedural Areas - Information given)    Assessment:    This is a routine wellness examination for this  patient .   Vision/Hearing screen  Hearing Screening   _0  _1  _2  _3  _4  _5  _6  _7  _8   Right ear:  Pass Pass Pass Pass  Pass    Left ear:  Pass Pass Pass Pass  Pass      Visual Acuity Screening   Right eye Left eye Both eyes  Without correction: _9  With correction:       Dietary issues and exercise activities discussed:  Current Exercise Habits: Home exercise routine, Type of exercise: stretching;walking (doing different projects around the house like yard work.), Time (Minutes): 20, Frequency (Times/Week): 7, Weekly Exercise (Minutes/Week): 140, Intensity: Moderate, Exercise limited by: Other - see comments  Goals    . Patient Stated     Learn how to play golf    . Quit Smoking      Depression Screen PHQ 2/9 Scores 04/28/2020 10/08/2019 09/24/2018 03/31/2018  PHQ - 2 Score 1 0 0 3  PHQ- 9 Score - - - 3     Fall Risk Fall Risk  04/28/2020  Falls in the past year? 0    Cognitive Function:     6CIT Screen 04/28/2020  What Year? 0 points  What month? 0 points  What time? 0 points  Count back from 20 0 points  Months in reverse 0 points  Repeat phrase 0 points  Total Score 0    Patient Care Team: Glendale Chard, MD as PCP - General (Internal Medicine) Augustina Mood, Dwight Mission as Referring Physician (Dentistry)     Plan:   .1. Medicare welcome exam  A full exam was performed. The Welcome to Medicare annual wellness visit was performed including discussion of  advanced directives, assessment of functional status and cognitive function. EKG performed, NSR w/ LAE. PATIENT IS ADVISED TO GET 30-45 MINUTES REGULAR EXERCISE NO LESS THAN FOUR TO FIVE DAYS PER WEEK - BOTH WEIGHTBEARING EXERCISES AND AEROBIC ARE RECOMMENDED.  PATIENT IS ADVISED TO FOLLOW A HEALTHY DIET WITH AT LEAST SIX FRUITS/VEGGIES PER DAY, DECREASE INTAKE OF RED MEAT, AND TO INCREASE FISH INTAKE TO TWO DAYS PER WEEK.  MEATS/FISH SHOULD NOT BE FRIED, BAKED OR BROILED IS PREFERABLE.  I SUGGEST WEARING SPF 50 SUNSCREEN ON EXPOSED PARTS AND ESPECIALLY WHEN IN THE DIRECT SUNLIGHT FOR AN EXTENDED PERIOD OF TIME.  PLEASE AVOID FAST FOOD RESTAURANTS AND INCREASE YOUR WATER INTAKE. -EKG - POC Hemoccult Bld/Stl (1-Cd Office Dx)  2. Pap smear, low-risk Comments: Pelvic exam, pap smear performed. Stool heme negative.  - Cytology -Pap Smear  3. Essential hypertension, benign Comments: Chronic, well controlled. She will continue with current meds. She is encouraged to avoid adding salt to her foods. EKG performed, NSR w/ LAE. She will f/u in six months for re-evaluation.  - EKG 12-Lead - POCT Urinalysis Dipstick (81002) - POCT UA - Microalbumin - CBC - CMP14+EGFR - TRIBENZOR 40-5-25 MG TABS; Take 1 tablet by mouth daily.  Dispense: 90 tablet; Refill: 2  4. Overweight with body mass index (BMI) 25.0-29.9 Comments: Her BMI is acceptable for her demographic. She is encouraged to aim for at least 150 minutes of exercise per week.   5. Tobacco abuse counseling Smoking cessation instruction/counseling given:  counseled patient on the dangers of tobacco use, advised patient to stop smoking, and reviewed strategies to maximize success . She declines to have low dose CT scan.   I have personally reviewed and noted the following in the patient's chart:   . Medical and social history . Use of  alcohol, tobacco or illicit drugs  . Current medications and supplements . Functional ability and  status . Nutritional status . Physical activity . Advanced directives . List of other physicians . Hospitalizations, surgeries, and ER visits in previous 12 months . Vitals . Screenings to include cognitive, depression, and falls . Referrals and appointments  In addition, I have reviewed and discussed with patient certain preventive protocols, quality metrics, and best practice recommendations. A written personalized care plan for preventive services as well as general preventive health recommendations were provided to patient.    Maximino Greenland, MD 05/23/2020

## 2020-04-29 LAB — CBC
Hematocrit: 44.6 % (ref 34.0–46.6)
Hemoglobin: 14.5 g/dL (ref 11.1–15.9)
MCH: 28.3 pg (ref 26.6–33.0)
MCHC: 32.5 g/dL (ref 31.5–35.7)
MCV: 87 fL (ref 79–97)
Platelets: 252 10*3/uL (ref 150–450)
RBC: 5.12 x10E6/uL (ref 3.77–5.28)
RDW: 13 % (ref 11.7–15.4)
WBC: 5.2 10*3/uL (ref 3.4–10.8)

## 2020-04-29 LAB — CMP14+EGFR
ALT: 24 IU/L (ref 0–32)
AST: 20 IU/L (ref 0–40)
Albumin/Globulin Ratio: 1.9 (ref 1.2–2.2)
Albumin: 4.3 g/dL (ref 3.8–4.8)
Alkaline Phosphatase: 79 IU/L (ref 44–121)
BUN/Creatinine Ratio: 17 (ref 12–28)
BUN: 16 mg/dL (ref 8–27)
Bilirubin Total: 0.3 mg/dL (ref 0.0–1.2)
CO2: 25 mmol/L (ref 20–29)
Calcium: 9.8 mg/dL (ref 8.7–10.3)
Chloride: 101 mmol/L (ref 96–106)
Creatinine, Ser: 0.93 mg/dL (ref 0.57–1.00)
GFR calc Af Amer: 75 mL/min/{1.73_m2} (ref >59)
GFR calc non Af Amer: 65 mL/min/{1.73_m2} (ref >59)
Globulin, Total: 2.3 g/dL (ref 1.5–4.5)
Glucose: 100 mg/dL — ABNORMAL HIGH (ref 65–99)
Potassium: 3.7 mmol/L (ref 3.5–5.2)
Sodium: 142 mmol/L (ref 134–144)
Total Protein: 6.6 g/dL (ref 6.0–8.5)

## 2020-04-30 LAB — CYTOLOGY - PAP
Comment: NEGATIVE
Diagnosis: NEGATIVE
High risk HPV: POSITIVE — AB

## 2020-05-01 ENCOUNTER — Telehealth: Payer: Self-pay

## 2020-05-01 NOTE — Telephone Encounter (Signed)
The pt was notified that her tribenzor has been approved for coverage by her insurance company.

## 2020-09-17 DIAGNOSIS — E663 Overweight: Secondary | ICD-10-CM | POA: Diagnosis not present

## 2020-09-17 DIAGNOSIS — I739 Peripheral vascular disease, unspecified: Secondary | ICD-10-CM | POA: Diagnosis not present

## 2020-09-17 DIAGNOSIS — I10 Essential (primary) hypertension: Secondary | ICD-10-CM | POA: Diagnosis not present

## 2020-09-17 DIAGNOSIS — Z720 Tobacco use: Secondary | ICD-10-CM | POA: Diagnosis not present

## 2020-09-17 DIAGNOSIS — Z6827 Body mass index (BMI) 27.0-27.9, adult: Secondary | ICD-10-CM | POA: Diagnosis not present

## 2020-10-27 ENCOUNTER — Ambulatory Visit: Payer: TRICARE For Life (TFL) | Admitting: Internal Medicine

## 2020-10-29 ENCOUNTER — Other Ambulatory Visit: Payer: Self-pay | Admitting: Internal Medicine

## 2020-10-29 DIAGNOSIS — I10 Essential (primary) hypertension: Secondary | ICD-10-CM

## 2021-01-11 ENCOUNTER — Encounter: Payer: Self-pay | Admitting: Internal Medicine

## 2021-01-11 DIAGNOSIS — Z1231 Encounter for screening mammogram for malignant neoplasm of breast: Secondary | ICD-10-CM | POA: Diagnosis not present

## 2021-01-12 ENCOUNTER — Encounter: Payer: Self-pay | Admitting: Internal Medicine

## 2021-01-12 ENCOUNTER — Telehealth: Payer: Self-pay

## 2021-01-12 NOTE — Telephone Encounter (Signed)
I called the pt to schedule her an appt for a f/u.  The pt was a no show to her last appt.  Voicemail is full, letter sent.

## 2021-01-21 ENCOUNTER — Ambulatory Visit (INDEPENDENT_AMBULATORY_CARE_PROVIDER_SITE_OTHER): Payer: Medicare PPO | Admitting: Nurse Practitioner

## 2021-01-21 ENCOUNTER — Other Ambulatory Visit: Payer: Self-pay

## 2021-01-21 ENCOUNTER — Encounter: Payer: Self-pay | Admitting: Nurse Practitioner

## 2021-01-21 VITALS — BP 140/80 | HR 88 | Temp 98.5°F | Ht 62.0 in | Wt 165.6 lb

## 2021-01-21 DIAGNOSIS — E6609 Other obesity due to excess calories: Secondary | ICD-10-CM

## 2021-01-21 DIAGNOSIS — Z716 Tobacco abuse counseling: Secondary | ICD-10-CM | POA: Diagnosis not present

## 2021-01-21 DIAGNOSIS — Z6830 Body mass index (BMI) 30.0-30.9, adult: Secondary | ICD-10-CM | POA: Diagnosis not present

## 2021-01-21 DIAGNOSIS — I10 Essential (primary) hypertension: Secondary | ICD-10-CM

## 2021-01-21 LAB — CMP14+EGFR
ALT: 23 IU/L (ref 0–32)
AST: 21 IU/L (ref 0–40)
Albumin/Globulin Ratio: 2.2 (ref 1.2–2.2)
Albumin: 4.4 g/dL (ref 3.8–4.8)
Alkaline Phosphatase: 78 IU/L (ref 44–121)
BUN/Creatinine Ratio: 19 (ref 12–28)
BUN: 16 mg/dL (ref 8–27)
Bilirubin Total: 0.3 mg/dL (ref 0.0–1.2)
CO2: 23 mmol/L (ref 20–29)
Calcium: 9.5 mg/dL (ref 8.7–10.3)
Chloride: 104 mmol/L (ref 96–106)
Creatinine, Ser: 0.86 mg/dL (ref 0.57–1.00)
Globulin, Total: 2 g/dL (ref 1.5–4.5)
Glucose: 104 mg/dL — ABNORMAL HIGH (ref 65–99)
Potassium: 4.3 mmol/L (ref 3.5–5.2)
Sodium: 141 mmol/L (ref 134–144)
Total Protein: 6.4 g/dL (ref 6.0–8.5)
eGFR: 74 mL/min/{1.73_m2} (ref >59)

## 2021-01-21 LAB — CBC
Hematocrit: 42.9 % (ref 34.0–46.6)
Hemoglobin: 14.1 g/dL (ref 11.1–15.9)
MCH: 28.8 pg (ref 26.6–33.0)
MCHC: 32.9 g/dL (ref 31.5–35.7)
MCV: 88 fL (ref 79–97)
Platelets: 201 10*3/uL (ref 150–450)
RBC: 4.89 x10E6/uL (ref 3.77–5.28)
RDW: 12.9 % (ref 11.7–15.4)
WBC: 4.8 10*3/uL (ref 3.4–10.8)

## 2021-01-21 NOTE — Patient Instructions (Signed)

## 2021-01-21 NOTE — Progress Notes (Signed)
I,Tianna Badgett,acting as a Education administrator for [employer], NP.,have documented all relevant documentation on the behalf of [employer], NP,as directed by  Bary Castilla, NP while in the presence of Bary Castilla, NP.  This visit occurred during the SARS-CoV-2 public health emergency.  Safety protocols were in place, including screening questions prior to the visit, additional usage of staff PPE, and extensive cleaning of exam room while observing appropriate contact time as indicated for disinfecting solutions.  Subjective:     Patient ID: Samantha Gomez , female    DOB: 1955/09/07 , 65 y.o.   MRN: [id number]   Chief Complaint  Patient presents with   Hypertension    HPI  She is here today for bp check. She has no other concerns. She is working on eating a healthy diet. She is trying to avoid salt and processed foods. Will check her labs today. She is going to make an appt for a physical exam later this year.   Hypertension This is a chronic problem. The current episode started more than 1 year ago. The problem is controlled. Pertinent negatives include no blurred vision, chest pain, headaches, palpitations or shortness of breath. Risk factors for coronary artery disease include smoking/tobacco exposure, stress and post-menopausal state. The current treatment provides moderate improvement. Compliance problems include exercise.     Past Medical History:  Diagnosis Date   Allergy    Benign hypertensive heart disease    Chronic kidney disease    Disorder of nasal cavity    Hypertension      Family History  Problem Relation Age of Onset   Dementia Mother    Lung cancer Father      Current Outpatient Medications:    b complex vitamins tablet, Take 1 tablet by mouth daily., Disp: , Rfl:    Cholecalciferol (VITAMIN D-3) 5000 units TABS, Take by mouth daily., Disp: , Rfl:    niacin 250 MG tablet, Take 250 mg by mouth daily., Disp: , Rfl:    TRIBENZOR 40-5-25 MG TABS, TAKE  1 TABLET BY MOUTH DAILY, Disp: 90 tablet, Rfl: 2   No Known Allergies   Review of Systems  Constitutional:  Negative for chills and fever.  HENT:  Negative for congestion.   Eyes:  Negative for blurred vision.  Respiratory: Negative.  Negative for shortness of breath and wheezing.   Cardiovascular: Negative.  Negative for chest pain and palpitations.  Gastrointestinal: Negative.   Neurological: Negative.  Negative for headaches.    Today's Vitals   01/21/21 0844  BP: 140/80  Pulse: 88  Temp: 98.5 F (36.9 C)  TempSrc: Oral  Weight: 165 lb 9.6 oz (75.1 kg)  Height: '5\' 2"'  (1.575 m)   Body mass index is 30.29 kg/m.  Wt Readings from Last 3 Encounters:  01/21/21 165 lb 9.6 oz (75.1 kg)  04/28/20 159 lb 3.2 oz (72.2 kg)  10/08/19 163 lb 3.2 oz (74 kg)    Objective:  Physical Exam Constitutional:      Appearance: Normal appearance.  HENT:     Head: Normocephalic and atraumatic.  Cardiovascular:     Rate and Rhythm: Normal rate and regular rhythm.     Pulses: Normal pulses.     Heart sounds: Normal heart sounds. No murmur heard. Pulmonary:     Effort: Pulmonary effort is normal. No respiratory distress.     Breath sounds: Normal breath sounds. No wheezing.  Skin:    General: Skin is warm and dry.     Capillary  Refill: Capillary refill takes less than 2 seconds.  Neurological:     Mental Status: She is alert and oriented to person, place, and time.        Assessment And Plan:     1. Essential hypertension, benign -Limit the intake of processed foods and salt intake. You should increase your intake of green vegetables and fruits. Limit the use of alcohol. Limit fast foods and fried foods. Avoid high fatty saturated and trans fat foods. Keep yourself hydrated with drinking water. Avoid red meats. Eat lean meats instead. Exercise for atleast 30-45 min for atleast 4-5 times a week.  - CMP14+EGFR - CBC no Diff  2. Tobacco abuse counseling -she has cut down to 5 cig a  day. -Smoking cessation instruction/counseling given:  counseled patient on the dangers of tobacco use, advised patient to stop smoking, and reviewed strategies to maximize success   3. Class 1 obesity due to excess calories with serious comorbidity and body mass index (BMI) of 30.0 to 30.9 in adult -Advised patient on a healthy diet including avoiding fast food and red meats. Increase the intake of lean meats including grilled chicken and [place].  Drink a lot of water. Decrease intake of fatty foods. Exercise for 30-45 min. 4-5 a week to decrease the risk of cardiac event.   Follow up: if symptoms persist or do not get better.   The patient was encouraged to call or send a message through [place] for any questions or concerns.   Side effects and appropriate use of all the medication(s) were discussed with the patient today. Patient advised to use the medication(s) as directed by their healthcare provider. The patient was encouraged to read, review, and understand all associated package inserts and contact our office with any questions or concerns. The patient accepts the risks of the treatment plan and had an opportunity to ask questions.   Patient was given opportunity to ask questions. Patient verbalized understanding of the plan and was able to repeat key elements of the plan. All questions were answered to their satisfaction.  Raman Teka Chanda, DNP   I, Raman Aleka Twitty have reviewed all documentation for this visit. The documentation on 01/21/21 for the exam, diagnosis, procedures, and orders are all accurate and complete.    IF YOU HAVE BEEN REFERRED TO A SPECIALIST, IT MAY TAKE 1-2 WEEKS TO SCHEDULE/PROCESS THE REFERRAL. IF YOU HAVE NOT HEARD FROM US/SPECIALIST IN TWO WEEKS, PLEASE GIVE [place] A CALL AT [phone].   THE PATIENT IS ENCOURAGED TO PRACTICE SOCIAL DISTANCING DUE TO THE COVID-19 PANDEMIC.

## 2021-05-06 ENCOUNTER — Other Ambulatory Visit (HOSPITAL_COMMUNITY)
Admission: RE | Admit: 2021-05-06 | Discharge: 2021-05-06 | Disposition: A | Discharge status: Home / Self Care | Payer: Medicare PPO | Source: Ambulatory Visit | Attending: Internal Medicine | Admitting: Internal Medicine

## 2021-05-06 ENCOUNTER — Ambulatory Visit (INDEPENDENT_AMBULATORY_CARE_PROVIDER_SITE_OTHER): Payer: Medicare PPO | Admitting: Internal Medicine

## 2021-05-06 ENCOUNTER — Encounter: Payer: Self-pay | Admitting: Internal Medicine

## 2021-05-06 ENCOUNTER — Other Ambulatory Visit: Payer: Self-pay

## 2021-05-06 VITALS — BP 132/78 | HR 90 | Temp 99.0°F | Ht 64.0 in | Wt 164.4 lb

## 2021-05-06 DIAGNOSIS — Z01419 Encounter for gynecological examination (general) (routine) without abnormal findings: Secondary | ICD-10-CM | POA: Insufficient documentation

## 2021-05-06 DIAGNOSIS — Z1151 Encounter for screening for human papillomavirus (HPV): Secondary | ICD-10-CM | POA: Insufficient documentation

## 2021-05-06 DIAGNOSIS — E663 Overweight: Secondary | ICD-10-CM

## 2021-05-06 DIAGNOSIS — Z720 Tobacco use: Secondary | ICD-10-CM

## 2021-05-06 DIAGNOSIS — R7309 Other abnormal glucose: Secondary | ICD-10-CM | POA: Diagnosis not present

## 2021-05-06 DIAGNOSIS — Z6826 Body mass index (BMI) 26.0-26.9, adult: Secondary | ICD-10-CM | POA: Insufficient documentation

## 2021-05-06 DIAGNOSIS — Z2821 Immunization not carried out because of patient refusal: Secondary | ICD-10-CM | POA: Diagnosis not present

## 2021-05-06 DIAGNOSIS — I10 Essential (primary) hypertension: Secondary | ICD-10-CM

## 2021-05-06 DIAGNOSIS — E559 Vitamin D deficiency, unspecified: Secondary | ICD-10-CM

## 2021-05-06 DIAGNOSIS — R0989 Other specified symptoms and signs involving the circulatory and respiratory systems: Secondary | ICD-10-CM | POA: Diagnosis not present

## 2021-05-06 DIAGNOSIS — Z6828 Body mass index (BMI) 28.0-28.9, adult: Secondary | ICD-10-CM

## 2021-05-06 DIAGNOSIS — Z0000 Encounter for general adult medical examination without abnormal findings: Secondary | ICD-10-CM | POA: Diagnosis not present

## 2021-05-06 LAB — POCT UA - MICROALBUMIN
Albumin/Creatinine Ratio, Urine, POC: <30
Creatinine, POC: 200 mg/dL
Microalbumin Ur, POC: 10 mg/L

## 2021-05-06 LAB — POCT URINALYSIS DIPSTICK
Bilirubin, UA: NEGATIVE
Glucose, UA: NEGATIVE
Ketones, UA: NEGATIVE
Leukocytes, UA: NEGATIVE
Nitrite, UA: NEGATIVE
Protein, UA: NEGATIVE
Spec Grav, UA: 1.01 (ref 1.010–1.025)
Urobilinogen, UA: 0.2 E.U./dL
pH, UA: 5.5 (ref 5.0–8.0)

## 2021-05-06 LAB — POC HEMOCCULT BLD/STL (OFFICE/1-CARD/DIAGNOSTIC): Fecal Occult Blood, POC: NEGATIVE

## 2021-05-06 NOTE — Progress Notes (Signed)
I,Samantha Gomez,acting as a Education administrator for Samantha Greenland, MD.,have documented all relevant documentation on the behalf of Samantha Greenland, MD,as directed by  Samantha Greenland, MD while in the presence of Samantha Greenland, MD.  This visit occurred during the SARS-CoV-2 public health emergency.  Safety protocols were in place, including screening questions prior to the visit, additional usage of staff PPE, and extensive cleaning of exam room while observing appropriate contact time as indicated for disinfecting solutions.  Subjective:     Patient ID: Samantha Gomez , female    DOB: 05-Feb-1956 , 65 y.o.   MRN: [id number]   Chief Complaint  Patient presents with   Annual Exam   Hypertension     HPI  She is here today for a full physical examination with pap.  She reports compliance with meds. Denies headaches, chest pain and shortness of breath. She reports she has incorporated more exercise into her daily routine.   Hypertension This is a chronic problem. The current episode started more than 1 year ago. The problem is controlled. Pertinent negatives include no blurred vision, chest pain, palpitations or shortness of breath. Risk factors for coronary artery disease include smoking/tobacco exposure, stress and post-menopausal state.    Past Medical History:  Diagnosis Date   Allergy    Benign hypertensive heart disease    Chronic kidney disease    Disorder of nasal cavity    Hypertension      Family History  Problem Relation Age of Onset   Dementia Mother    Lung cancer Father      Current Outpatient Medications:    b complex vitamins tablet, Take 1 tablet by mouth daily., Disp: , Rfl:    Cholecalciferol (VITAMIN D-3) 5000 units TABS, Take by mouth daily., Disp: , Rfl:    niacin 250 MG tablet, Take 250 mg by mouth daily., Disp: , Rfl:    TRIBENZOR 40-5-25 MG TABS, TAKE 1 TABLET BY MOUTH DAILY, Disp: 90 tablet, Rfl: 2   No Known Allergies    The patient states she uses post  menopausal status for birth control. Last LMP was No LMP recorded. Patient is postmenopausal.. Negative for Dysmenorrhea. Negative for: breast discharge, breast lump(s), breast pain and breast self exam. Associated symptoms include abnormal vaginal bleeding. Pertinent negatives include abnormal bleeding (hematology), anxiety, decreased libido, depression, difficulty falling sleep, dyspareunia, history of infertility, nocturia, sexual dysfunction, sleep disturbances, urinary incontinence, urinary urgency, vaginal discharge and vaginal itching. Diet regular.The patient states her exercise level is  moderate.   . The patient's tobacco use is:  Social History   Tobacco Use  Smoking Status Heavy Smoker   Packs/day: 0.75   Years: 20.00   Pack years: 15.00   Types: Cigarettes   Start date: 10/07/1969  Smokeless Tobacco Never  Tobacco Comments   1ppdx20 years, plus 3/4ppdx 15   . She has been exposed to passive smoke. The patient's alcohol use is:  Social History   Substance and Sexual Activity  Alcohol Use Yes   Alcohol/week: 2.0 standard drinks   Types: 2 Shots of liquor per week   Review of Systems  Constitutional: Negative.   HENT: Negative.    Eyes: Negative.  Negative for blurred vision.  Respiratory: Negative.  Negative for shortness of breath.   Cardiovascular: Negative.  Negative for chest pain and palpitations.  Gastrointestinal: Negative.   Endocrine: Negative.   Genitourinary: Negative.   Musculoskeletal: Negative.   Skin: Negative.   Allergic/Immunologic: Negative.  Neurological: Negative.   Hematological: Negative.   Psychiatric/Behavioral: Negative.      Today's Vitals   05/06/21 1001  BP: 132/78  Pulse: 90  Temp: 99 F (37.2 C)  Weight: 164 lb 6.4 oz (74.6 kg)  Height: 5' 4" (1.626 m)   Body mass index is 28.22 kg/m.  Wt Readings from Last 3 Encounters:  05/13/21 164 lb (74.4 kg)  05/06/21 164 lb 6.4 oz (74.6 kg)  01/21/21 165 lb 9.6 oz (75.1 kg)    BP  Readings from Last 3 Encounters:  05/06/21 132/78  01/21/21 140/80  04/28/20 116/74    Objective:  Physical Exam Vitals and nursing note reviewed. Exam conducted with a chaperone present.  Constitutional:      Appearance: Normal appearance.  HENT:     Head: Normocephalic and atraumatic.     Right Ear: Tympanic membrane, ear canal and external ear normal.     Left Ear: Tympanic membrane, ear canal and external ear normal.     Nose:     Comments: Masked     Mouth/Throat:     Comments: Masked  Eyes:     Extraocular Movements: Extraocular movements intact.     Conjunctiva/sclera: Conjunctivae normal.     Pupils: Pupils are equal, round, and reactive to light.  Cardiovascular:     Rate and Rhythm: Normal rate and regular rhythm.     Pulses: Normal pulses.     Heart sounds: Normal heart sounds.  Pulmonary:     Effort: Pulmonary effort is normal.     Breath sounds: Normal breath sounds.  Chest:  Breasts:    Tanner Score is 5.     Right: Normal.     Left: Normal.  Abdominal:     General: Abdomen is flat. Bowel sounds are normal.     Palpations: Abdomen is soft.     Hernia: There is no hernia in the left inguinal area or right inguinal area.  Genitourinary:    General: Normal vulva.     Exam position: Lithotomy position.     Pubic Area: No rash or pubic lice.      Tanner stage (genital): 5.     Vagina: Normal.     Cervix: Friability and erythema present.     Uterus: Normal.      Adnexa:        Right: No mass or tenderness.         Left: No mass or tenderness.       Rectum: Normal. Guaiac result negative. No tenderness.  Musculoskeletal:        General: Normal range of motion.     Cervical back: Normal range of motion and neck supple.  Lymphadenopathy:     Lower Body: No right inguinal adenopathy. No left inguinal adenopathy.  Skin:    General: Skin is warm and dry.  Neurological:     General: No focal deficit present.     Mental Status: She is alert and oriented to  person, place, and time.  Psychiatric:        Mood and Affect: Mood normal.        Behavior: Behavior normal.        Assessment And Plan:     1. Encounter for physical examination Comments: A full exam was performed. Importance of monthly self breast exams was discussed with the patient. Pap smear performed, stool heme negative. PATIENT IS ADVISED TO GET 30-45 MINUTES REGULAR EXERCISE NO LESS THAN FOUR TO FIVE DAYS  PER WEEK - BOTH WEIGHTBEARING EXERCISES AND AEROBIC ARE RECOMMENDED.  PATIENT IS ADVISED TO FOLLOW A HEALTHY DIET WITH AT LEAST SIX FRUITS/VEGGIES PER DAY, DECREASE INTAKE OF RED MEAT, AND TO INCREASE FISH INTAKE TO TWO DAYS PER WEEK.  MEATS/FISH SHOULD NOT BE FRIED, BAKED OR BROILED IS PREFERABLE.  IT IS ALSO IMPORTANT TO CUT BACK ON YOUR SUGAR INTAKE. PLEASE AVOID ANYTHING WITH ADDED SUGAR, CORN SYRUP OR OTHER SWEETENERS. IF YOU MUST USE A SWEETENER, YOU CAN TRY STEVIA. IT IS ALSO IMPORTANT TO AVOID ARTIFICIALLY SWEETENERS AND DIET BEVERAGES. LASTLY, I SUGGEST WEARING SPF 50 SUNSCREEN ON EXPOSED PARTS AND ESPECIALLY WHEN IN THE DIRECT SUNLIGHT FOR AN EXTENDED PERIOD OF TIME.  PLEASE AVOID FAST FOOD RESTAURANTS AND INCREASE YOUR WATER INTAKE.   2. Cervical smear, as part of routine gynecological examination Comments: Pap smear performed, stool is hemoccult is neg. Co-testing performed as well.  - Cytology -Pap Smear - POC Hemoccult Bld/Stl (1-Cd Office Dx)  3. Essential hypertension, benign Comments: Chronic, fair control. Goal BP <130/80. No med changes today. Encouraged to follow low sodium diet. EKG performed, NSR w/ LAE. She will f/u in 6 months. - POCT Urinalysis Dipstick (81002) - POCT UA - Microalbumin - EKG 12-Lead - CBC - CMP14+EGFR - Lipid panel  4. Decreased dorsalis pedis pulse Comments: She is a smoker, I will schedule her for ABIs.  - VAS [place] ABI WITH/WO TBI; Future  5. Vitamin D deficiency Comments: I will check vitamin D level and supplement as needed.  -  VITAMIN D 25 Hydroxy (Vit-D Deficiency, Fractures)  6. Other abnormal glucose Comments: Her a1c has been elevated in the past. I will recheck this today. Encouraged to limit her intake of sweetened beverages, including diet drinks.  - Hemoglobin A1c  7. Overweight with body mass index (BMI) of 28 to 28.9 in adult Comments: She is advised to aim for at least 150 minutes of exercise per week.   8. Tobacco consumption Comments: She declines low dose CT scan for lung CA screening. She was commended on cutting back number of cigs smoked/day and encouraged to pick a quit date.  Smoking cessation instruction/counseling given:  counseled patient on the dangers of tobacco use, advised patient to stop smoking, and reviewed strategies to maximize success   9. Influenza vaccination declined  Patient was given opportunity to ask questions. Patient verbalized understanding of the plan and was able to repeat key elements of the plan. All questions were answered to their satisfaction.   I, Samantha Greenland, MD, have reviewed all documentation for this visit. The documentation on 05/24/21 for the exam, diagnosis, procedures, and orders are all accurate and complete.   THE PATIENT IS ENCOURAGED TO PRACTICE SOCIAL DISTANCING DUE TO THE COVID-19 PANDEMIC.

## 2021-05-06 NOTE — Patient Instructions (Signed)

## 2021-05-07 LAB — LIPID PANEL
Chol/HDL Ratio: 2.3 ratio (ref 0.0–4.4)
Cholesterol, Total: 221 mg/dL — ABNORMAL HIGH (ref 100–199)
HDL: 95 mg/dL (ref >39)
LDL Chol Calc (NIH): 115 mg/dL — ABNORMAL HIGH (ref 0–99)
Triglycerides: 61 mg/dL (ref 0–149)
VLDL Cholesterol Cal: 11 mg/dL (ref 5–40)

## 2021-05-07 LAB — CMP14+EGFR
ALT: 21 IU/L (ref 0–32)
AST: 19 IU/L (ref 0–40)
Albumin/Globulin Ratio: 2.2 (ref 1.2–2.2)
Albumin: 4.4 g/dL (ref 3.8–4.8)
Alkaline Phosphatase: 77 IU/L (ref 44–121)
BUN/Creatinine Ratio: 14 (ref 12–28)
BUN: 14 mg/dL (ref 8–27)
Bilirubin Total: 0.3 mg/dL (ref 0.0–1.2)
CO2: 23 mmol/L (ref 20–29)
Calcium: 9.1 mg/dL (ref 8.7–10.3)
Chloride: 101 mmol/L (ref 96–106)
Creatinine, Ser: 0.98 mg/dL (ref 0.57–1.00)
Globulin, Total: 2 g/dL (ref 1.5–4.5)
Glucose: 98 mg/dL (ref 70–99)
Potassium: 3.9 mmol/L (ref 3.5–5.2)
Sodium: 140 mmol/L (ref 134–144)
Total Protein: 6.4 g/dL (ref 6.0–8.5)
eGFR: 64 mL/min/{1.73_m2} (ref >59)

## 2021-05-07 LAB — VITAMIN D 25 HYDROXY (VIT D DEFICIENCY, FRACTURES): Vit D, 25-Hydroxy: 51.5 ng/mL (ref 30.0–100.0)

## 2021-05-07 LAB — CBC
Hematocrit: 41.8 % (ref 34.0–46.6)
Hemoglobin: 14.3 g/dL (ref 11.1–15.9)
MCH: 29 pg (ref 26.6–33.0)
MCHC: 34.2 g/dL (ref 31.5–35.7)
MCV: 85 fL (ref 79–97)
Platelets: 232 10*3/uL (ref 150–450)
RBC: 4.93 x10E6/uL (ref 3.77–5.28)
RDW: 11.9 % (ref 11.7–15.4)
WBC: 3.8 10*3/uL (ref 3.4–10.8)

## 2021-05-07 LAB — HEMOGLOBIN A1C
Est. average glucose Bld gHb Est-mCnc: 114 mg/dL
Hgb A1c MFr Bld: 5.6 % (ref 4.8–5.6)

## 2021-05-13 ENCOUNTER — Ambulatory Visit (INDEPENDENT_AMBULATORY_CARE_PROVIDER_SITE_OTHER): Payer: Medicare PPO

## 2021-05-13 VITALS — Ht 64.0 in | Wt 164.0 lb

## 2021-05-13 DIAGNOSIS — Z0000 Encounter for general adult medical examination without abnormal findings: Secondary | ICD-10-CM

## 2021-05-13 NOTE — Progress Notes (Signed)
I connected with Samantha Gomez today by telephone and verified that I am speaking with the correct person using two identifiers. Location patient: home Location provider: work Persons participating in the virtual visit: Librarian, academic, Samantha Ponder LPN.   I discussed the limitations, risks, security and privacy concerns of performing an evaluation and management service by telephone and the availability of in person appointments. I also discussed with the patient that there may be a patient responsible charge related to this service. The patient expressed understanding and verbally consented to this telephonic visit.    Interactive audio and video telecommunications were attempted between this provider and patient, however failed, due to patient having technical difficulties OR patient did not have access to video capability.  We continued and completed visit with audio only.     Vital signs may be patient reported or missing.  Subjective:   Samantha Gomez is a 65 y.o. female who presents for an Initial Medicare Annual Wellness Visit.  Review of Systems     Cardiac Risk Factors include: advanced age (>39men, >9 women);hypertension;smoking/ tobacco exposure     Objective:    Today's Vitals   05/13/21 0956  Weight: 164 lb (74.4 kg)  Height: 5\' 4"  (1.626 m)   Body mass index is 28.15 kg/m.  Advanced Directives 05/13/2021 04/28/2020 04/28/2020  Does Patient Have a Medical Advance Directive? No No No  Would patient like information on creating a medical advance directive? - Yes (MAU/Ambulatory/Procedural Areas - Information given) Yes (ED - Information included in AVS)    Current Medications (verified) Outpatient Encounter Medications as of 05/13/2021  Medication Sig   b complex vitamins tablet Take 1 tablet by mouth daily.   Cholecalciferol (VITAMIN D-3) 5000 units TABS Take by mouth daily.   niacin 250 MG tablet Take 250 mg by mouth daily.   TRIBENZOR 40-5-25 MG TABS TAKE 1  TABLET BY MOUTH DAILY   No facility-administered encounter medications on file as of 05/13/2021.    Allergies (verified) Patient has no known allergies.   History: Past Medical History:  Diagnosis Date   Allergy    Benign hypertensive heart disease    Chronic kidney disease    Disorder of nasal cavity    Hypertension    History reviewed. No pertinent surgical history. Family History  Problem Relation Age of Onset   Dementia Mother    Lung cancer Father    Social History   Socioeconomic History   Marital status: Single    Spouse name: Not on file   Number of children: Not on file   Years of education: Not on file   Highest education level: Not on file  Occupational History   Not on file  Tobacco Use   Smoking status: Heavy Smoker    Packs/day: 0.75    Years: 20.00    Pack years: 15.00    Types: Cigarettes    Start date: 10/07/1969   Smokeless tobacco: Never   Tobacco comments:    1ppdx20 years, plus 3/4ppdx 15   Vaping Use   Vaping Use: Never used  Substance and Sexual Activity   Alcohol use: Yes    Alcohol/week: 2.0 standard drinks    Types: 2 Shots of liquor per week   Drug use: Never   Sexual activity: Not Currently  Other Topics Concern   Not on file  Social History Narrative   Not on file   Social Determinants of Health   Financial Resource Strain: Low Risk    Difficulty  of Paying Living Expenses: Not hard at all  Food Insecurity: No Food Insecurity   Worried About Running Out of Food in the Last Year: Never true   Ran Out of Food in the Last Year: Never true  Transportation Needs: No Transportation Needs   Lack of Transportation (Medical): No   Lack of Transportation (Non-Medical): No  Physical Activity: Insufficiently Active   Days of Exercise per Week: 7 days   Minutes of Exercise per Session: 20 min  Stress: No Stress Concern Present   Feeling of Stress : Only a little  Social Connections: Not on file    Tobacco Counseling Ready to  quit: Yes Counseling given: Not Answered Tobacco comments: 1ppdx20 years, plus 3/4ppdx 15    Clinical Intake:  Pre-visit preparation completed: Yes  Pain : No/denies pain     Nutritional Status: BMI 25 -29 Overweight Nutritional Risks: None Diabetes: No  How often do you need to have someone help you when you read instructions, pamphlets, or other written materials from your doctor or pharmacy?: 1 - Never What is the last grade level you completed in school?: masters degree  Diabetic? no  Interpreter Needed?: No  Information entered by :: NAllen LPN   Activities of Daily Living In your present state of health, do you have any difficulty performing the following activities: 05/13/2021 05/06/2021  Hearing? N N  Vision? Y N  Comment trouble seeing up close -  Difficulty concentrating or making decisions? N N  Walking or climbing stairs? N N  Dressing or bathing? N N  Doing errands, shopping? N N  Preparing Food and eating ? N -  Using the Toilet? N -  In the past six months, have you accidently leaked urine? N -  Do you have problems with loss of bowel control? N -  Managing your Medications? N -  Managing your Finances? N -  Housekeeping or managing your Housekeeping? N -  Some recent data might be hidden    Patient Care Team: Dorothyann Peng, MD as PCP - General (Internal Medicine) Irene Limbo, DDS as Referring Physician (Dentistry)  Indicate any recent Medical Services you may have received from other than [facility] providers in the past year (date may be approximate).     Assessment:   This is a routine wellness examination for [name].  Hearing/Vision screen Vision Screening - Comments:: No regular eye exams  Dietary issues and exercise activities discussed: Current Exercise Habits: Home exercise routine, Type of exercise: walking, Time (Minutes): 20, Frequency (Times/Week): 7, Weekly Exercise (Minutes/Week): 140   Goals Addressed             This  Visit's Progress    Patient Stated       05/13/2021, wants to weigh 145-150 pounds       Depression Screen PHQ 2/9 Scores 05/13/2021 05/06/2021 04/28/2020 10/08/2019 09/24/2018 03/31/2018  PHQ - 2 Score 0 0 1 0 0 3  PHQ- 9 Score - - - - - 3    Fall Risk Fall Risk  05/13/2021 05/06/2021 04/28/2020 04/09/2019 09/24/2018  Falls in the past year? 0 0 0 0 0  Number falls in past yr: - 0 - - -  Injury with Fall? - 0 - - -  Follow up Falls evaluation completed;Education provided;Falls prevention discussed - - - -    FALL RISK PREVENTION PERTAINING TO THE HOME:  Any stairs in or around the home? Yes  If so, are there any without handrails? Yes  Home  free of loose throw rugs in walkways, pet beds, electrical cords, etc? Yes  Adequate lighting in your home to reduce risk of falls? Yes   ASSISTIVE DEVICES UTILIZED TO PREVENT FALLS:  Life alert? No  Use of a cane, walker or w/c? No  Grab bars in the bathroom? No  Shower chair or bench in shower? No  Elevated toilet seat or a handicapped toilet? No   TIMED UP AND GO:  Was the test performed? No .      Cognitive Function:     6CIT Screen 05/13/2021 04/28/2020  What Year? 0 points 0 points  What month? 0 points 0 points  What time? 0 points 0 points  Count back from 20 0 points 0 points  Months in reverse 0 points 0 points  Repeat phrase 2 points 0 points  Total Score 2 0    Immunizations Immunization History  Administered Date(s) Administered   DTaP 09/22/2012   PFIZER Comirnaty(Gray Top)Covid-19 Tri-Sucrose Vaccine 05/14/2020   PFIZER(Purple Top)SARS-COV-2 Vaccination 08/22/2019, 09/19/2019    TDAP status: Up to date  Flu Vaccine status: Declined, Education has been provided regarding the importance of this vaccine but patient still declined. Advised may receive this vaccine at local pharmacy or Health Dept. Aware to provide a copy of the vaccination record if obtained from local pharmacy or Health Dept. Verbalized  acceptance and understanding.  Pneumococcal vaccine status: Declined,  Education has been provided regarding the importance of this vaccine but patient still declined. Advised may receive this vaccine at local pharmacy or Health Dept. Aware to provide a copy of the vaccination record if obtained from local pharmacy or Health Dept. Verbalized acceptance and understanding.   Covid-19 vaccine status: Completed vaccines  Qualifies for Shingles Vaccine? Yes   Zostavax completed No   Shingrix Completed?: No.    Education has been provided regarding the importance of this vaccine. Patient has been advised to call insurance company to determine out of pocket expense if they have not yet received this vaccine. Advised may also receive vaccine at local pharmacy or Health Dept. Verbalized acceptance and understanding.  Screening Tests Health Maintenance  Topic Date Due   Zoster Vaccines- Shingrix (1 of 2) Never done   COVID-19 Vaccine (4 - Booster for Pfizer series) 07/09/2020   INFLUENZA VACCINE  08/20/2021 (Originally 12/21/2020)   Pneumonia Vaccine 35+ Years old (1 - PCV) 05/06/2022 (Originally 11/27/1961)   TETANUS/TDAP  09/23/2022   MAMMOGRAM  02/20/2023   COLONOSCOPY (Pts 45-33yrs Insurance coverage will need to be confirmed)  06/07/2025   DEXA SCAN  Completed   Hepatitis C Screening  Completed   HPV VACCINES  Aged Out    Health Maintenance  Health Maintenance Due  Topic Date Due   Zoster Vaccines- Shingrix (1 of 2) Never done   COVID-19 Vaccine (4 - Booster for Pfizer series) 07/09/2020    Colorectal cancer screening: Type of screening: Colonoscopy. Completed 06/08/2015. Repeat every 10 years  Mammogram status: Completed 02/19/2021. Repeat every year  Bone Density status: Completed 10/27/2016.   Lung Cancer Screening: (Low Dose CT Chest recommended if Age 61-80 years, 30 pack-year currently smoking OR have quit w/in 15years.) does not qualify.   Lung Cancer Screening Referral:  no  Additional Screening:  Hepatitis C Screening: does qualify; Completed 09/22/2012  Vision Screening: Recommended annual ophthalmology exams for early detection of glaucoma and other disorders of the eye. Is the patient up to date with their annual eye exam?  No  Who is  the provider or what is the name of the office in which the patient attends annual eye exams? none If pt is not established with a provider, would they like to be referred to a provider to establish care? No .   Dental Screening: Recommended annual dental exams for proper oral hygiene  Community Resource Referral / Chronic Care Management: CRR required this visit?  No   CCM required this visit?  No      Plan:     I have personally reviewed and noted the following in the patients chart:   Medical and social history Use of alcohol, tobacco or illicit drugs  Current medications and supplements including opioid prescriptions. Patient is not currently taking opioid prescriptions. Functional ability and status Nutritional status Physical activity Advanced directives List of other physicians Hospitalizations, surgeries, and ER visits in previous 12 months Vitals Screenings to include cognitive, depression, and falls Referrals and appointments  In addition, I have reviewed and discussed with patient certain preventive protocols, quality metrics, and best practice recommendations. A written personalized care plan for preventive services as well as general preventive health recommendations were provided to patient.     Barb Merino, LPN   16/02/[id number]   Nurse Notes: none

## 2021-05-13 NOTE — Patient Instructions (Signed)
Samantha Gomez , Thank you for taking time to come for your Medicare Wellness Visit. I appreciate your ongoing commitment to your health goals. Please review the following plan we discussed and let me know if I can assist you in the future.   Screening recommendations/referrals: Colonoscopy: completed 06/08/2015 Mammogram: completed 02/19/2021 Bone Density: completed 10/27/2016 Recommended yearly ophthalmology/optometry visit for glaucoma screening and checkup Recommended yearly dental visit for hygiene and checkup  Vaccinations: Influenza vaccine: decline Pneumococcal vaccine: decline Tdap vaccine: completed 09/22/2012, due 09/23/2022 Shingles vaccine: discussed   Covid-19: 05/14/2020, 09/19/2019, 08/22/2019  Advanced directives: mailed a copy  Conditions/risks identified: smoking  Next appointment: Follow up in one year for your annual wellness visit    Preventive Care 65 Years and Older, Female Preventive care refers to lifestyle choices and visits with your health care provider that can promote health and wellness. What does preventive care include? A yearly physical exam. This is also called an annual well check. Dental exams once or twice a year. Routine eye exams. Ask your health care provider how often you should have your eyes checked. Personal lifestyle choices, including: Daily care of your teeth and gums. Regular physical activity. Eating a healthy diet. Avoiding tobacco and drug use. Limiting alcohol use. Practicing safe sex. Taking low-dose aspirin every day. Taking vitamin and mineral supplements as recommended by your health care provider. What happens during an annual well check? The services and screenings done by your health care provider during your annual well check will depend on your age, overall health, lifestyle risk factors, and family history of disease. Counseling  Your health care provider may ask you questions about your: Alcohol use. Tobacco use. Drug  use. Emotional well-being. Home and relationship well-being. Sexual activity. Eating habits. History of falls. Memory and ability to understand (cognition). Work and work Astronomer. Reproductive health. Screening  You may have the following tests or measurements: Height, weight, and BMI. Blood pressure. Lipid and cholesterol levels. These may be checked every 5 years, or more frequently if you are over 64 years old. Skin check. Lung cancer screening. You may have this screening every year starting at age 34 if you have a 30-pack-year history of smoking and currently smoke or have quit within the past 15 years. Fecal occult blood test (FOBT) of the stool. You may have this test every year starting at age 4. Flexible sigmoidoscopy or colonoscopy. You may have a sigmoidoscopy every 5 years or a colonoscopy every 10 years starting at age 9. Hepatitis C blood test. Hepatitis B blood test. Sexually transmitted disease (STD) testing. Diabetes screening. This is done by checking your blood sugar (glucose) after you have not eaten for a while (fasting). You may have this done every 1-3 years. Bone density scan. This is done to screen for osteoporosis. You may have this done starting at age 69. Mammogram. This may be done every 1-2 years. Talk to your health care provider about how often you should have regular mammograms. Talk with your health care provider about your test results, treatment options, and if necessary, the need for more tests. Vaccines  Your health care provider may recommend certain vaccines, such as: Influenza vaccine. This is recommended every year. Tetanus, diphtheria, and acellular pertussis (Tdap, Td) vaccine. You may need a Td booster every 10 years. Zoster vaccine. You may need this after age 70. Pneumococcal 13-valent conjugate (PCV13) vaccine. One dose is recommended after age 35. Pneumococcal polysaccharide (PPSV23) vaccine. One dose is recommended after age  76.  Talk to your health care provider about which screenings and vaccines you need and how often you need them. This information is not intended to replace advice given to you by your health care provider. Make sure you discuss any questions you have with your health care provider. Document Released: 06/05/2015 Document Revised: 01/27/2016 Document Reviewed: 03/10/2015 Elsevier Interactive Patient Education  2017 [place] Prevention in the Home Falls can cause injuries. They can happen to people of all ages. There are many things you can do to make your home safe and to help prevent falls. What can I do on the outside of my home? Regularly fix the edges of walkways and driveways and fix any cracks. Remove anything that might make you trip as you walk through a door, such as a raised step or threshold. Trim any bushes or trees on the path to your home. Use bright outdoor lighting. Clear any walking paths of anything that might make someone trip, such as rocks or tools. Regularly check to see if handrails are loose or broken. Make sure that both sides of any steps have handrails. Any raised decks and porches should have guardrails on the edges. Have any leaves, snow, or ice cleared regularly. Use sand or salt on walking paths during winter. Clean up any spills in your garage right away. This includes oil or grease spills. What can I do in the bathroom? Use night lights. Install grab bars by the toilet and in the tub and shower. Do not use towel bars as grab bars. Use non-skid mats or decals in the tub or shower. If you need to sit down in the shower, use a plastic, non-slip stool. Keep the floor dry. Clean up any water that spills on the floor as soon as it happens. Remove soap buildup in the tub or shower regularly. Attach bath mats securely with double-sided non-slip rug tape. Do not have throw rugs and other things on the floor that can make you trip. What can I do in the  bedroom? Use night lights. Make sure that you have a light by your bed that is easy to reach. Do not use any sheets or blankets that are too big for your bed. They should not hang down onto the floor. Have a firm chair that has side arms. You can use this for support while you get dressed. Do not have throw rugs and other things on the floor that can make you trip. What can I do in the kitchen? Clean up any spills right away. Avoid walking on wet floors. Keep items that you use a lot in easy-to-reach places. If you need to reach something above you, use a strong step stool that has a grab bar. Keep electrical cords out of the way. Do not use floor polish or wax that makes floors slippery. If you must use wax, use non-skid floor wax. Do not have throw rugs and other things on the floor that can make you trip. What can I do with my stairs? Do not leave any items on the stairs. Make sure that there are handrails on both sides of the stairs and use them. Fix handrails that are broken or loose. Make sure that handrails are as long as the stairways. Check any carpeting to make sure that it is firmly attached to the stairs. Fix any carpet that is loose or worn. Avoid having throw rugs at the top or bottom of the stairs. If you do have throw rugs,  attach them to the floor with carpet tape. Make sure that you have a light switch at the top of the stairs and the bottom of the stairs. If you do not have them, ask someone to add them for you. What else can I do to help prevent falls? Wear shoes that: Do not have high heels. Have rubber bottoms. Are comfortable and fit you well. Are closed at the toe. Do not wear sandals. If you use a stepladder: Make sure that it is fully opened. Do not climb a closed stepladder. Make sure that both sides of the stepladder are locked into place. Ask someone to hold it for you, if possible. Clearly mark and make sure that you can see: Any grab bars or  handrails. First and last steps. Where the edge of each step is. Use tools that help you move around (mobility aids) if they are needed. These include: Canes. Walkers. Scooters. Crutches. Turn on the lights when you go into a dark area. Replace any light bulbs as soon as they burn out. Set up your furniture so you have a clear path. Avoid moving your furniture around. If any of your floors are uneven, fix them. If there are any pets around you, be aware of where they are. Review your medicines with your doctor. Some medicines can make you feel dizzy. This can increase your chance of falling. Ask your doctor what other things that you can do to help prevent falls. This information is not intended to replace advice given to you by your health care provider. Make sure you discuss any questions you have with your health care provider. Document Released: 03/05/2009 Document Revised: 10/15/2015 Document Reviewed: 06/13/2014 Elsevier Interactive Patient Education  2017 [employer].

## 2021-05-19 LAB — CYTOLOGY - PAP
Comment: NEGATIVE
Diagnosis: NEGATIVE
High risk HPV: NEGATIVE

## 2021-05-31 ENCOUNTER — Ambulatory Visit (HOSPITAL_COMMUNITY)
Admission: RE | Admit: 2021-05-31 | Discharge: 2021-05-31 | Disposition: A | Discharge status: Home / Self Care | Payer: Medicare PPO | Source: Ambulatory Visit | Attending: Internal Medicine | Admitting: Internal Medicine

## 2021-05-31 ENCOUNTER — Other Ambulatory Visit: Payer: Self-pay

## 2021-05-31 DIAGNOSIS — R0989 Other specified symptoms and signs involving the circulatory and respiratory systems: Secondary | ICD-10-CM

## 2021-06-21 DIAGNOSIS — Z720 Tobacco use: Secondary | ICD-10-CM | POA: Diagnosis not present

## 2021-06-21 DIAGNOSIS — I10 Essential (primary) hypertension: Secondary | ICD-10-CM | POA: Diagnosis not present

## 2021-06-21 DIAGNOSIS — Z6828 Body mass index (BMI) 28.0-28.9, adult: Secondary | ICD-10-CM | POA: Diagnosis not present

## 2021-06-21 DIAGNOSIS — E663 Overweight: Secondary | ICD-10-CM | POA: Diagnosis not present

## 2021-06-21 DIAGNOSIS — Z7982 Long term (current) use of aspirin: Secondary | ICD-10-CM | POA: Diagnosis not present

## 2021-07-29 ENCOUNTER — Other Ambulatory Visit: Payer: Self-pay | Admitting: Internal Medicine

## 2021-07-29 DIAGNOSIS — I10 Essential (primary) hypertension: Secondary | ICD-10-CM

## 2021-08-16 ENCOUNTER — Telehealth (INDEPENDENT_AMBULATORY_CARE_PROVIDER_SITE_OTHER): Payer: Self-pay

## 2021-08-16 NOTE — Telephone Encounter (Signed)
Prior auth completed, response was that PA already on file, approved from 05/23/2021 until 05/22/2022. ?

## 2021-09-22 ENCOUNTER — Other Ambulatory Visit: Payer: Self-pay

## 2021-09-22 DIAGNOSIS — I10 Essential (primary) hypertension: Secondary | ICD-10-CM

## 2021-09-22 MED ORDER — TRIBENZOR 40-5-25 MG PO TABS: 1.0000 | ORAL_TABLET | Freq: Every day | ORAL | 2 refills | Status: DC

## 2021-09-24 ENCOUNTER — Other Ambulatory Visit: Payer: Self-pay

## 2021-09-24 DIAGNOSIS — I10 Essential (primary) hypertension: Secondary | ICD-10-CM

## 2021-09-24 MED ORDER — TRIBENZOR 40-5-25 MG PO TABS: 1.0000 | ORAL_TABLET | Freq: Every day | ORAL | 2 refills | Status: DC

## 2021-11-16 ENCOUNTER — Ambulatory Visit: Payer: Medicare PPO | Admitting: Internal Medicine

## 2021-11-16 ENCOUNTER — Encounter: Payer: Self-pay | Admitting: Internal Medicine

## 2021-11-16 VITALS — BP 130/78 | HR 95 | Ht 64.0 in | Wt 163.2 lb

## 2021-11-16 DIAGNOSIS — Z6828 Body mass index (BMI) 28.0-28.9, adult: Secondary | ICD-10-CM | POA: Diagnosis not present

## 2021-11-16 DIAGNOSIS — Z23 Encounter for immunization: Secondary | ICD-10-CM | POA: Diagnosis not present

## 2021-11-16 DIAGNOSIS — E7800 Pure hypercholesterolemia, unspecified: Secondary | ICD-10-CM

## 2021-11-16 DIAGNOSIS — F17200 Nicotine dependence, unspecified, uncomplicated: Secondary | ICD-10-CM

## 2021-11-16 DIAGNOSIS — R2231 Localized swelling, mass and lump, right upper limb: Secondary | ICD-10-CM | POA: Diagnosis not present

## 2021-11-16 DIAGNOSIS — L819 Disorder of pigmentation, unspecified: Secondary | ICD-10-CM | POA: Diagnosis not present

## 2021-11-16 DIAGNOSIS — I10 Essential (primary) hypertension: Secondary | ICD-10-CM | POA: Diagnosis not present

## 2021-11-17 LAB — CMP14+EGFR
ALT: 24 IU/L (ref 0–32)
AST: 24 IU/L (ref 0–40)
Albumin/Globulin Ratio: 2.5 — ABNORMAL HIGH (ref 1.2–2.2)
Albumin: 4.5 g/dL (ref 3.8–4.8)
Alkaline Phosphatase: 83 IU/L (ref 44–121)
BUN/Creatinine Ratio: 17 (ref 12–28)
BUN: 17 mg/dL (ref 8–27)
Bilirubin Total: 0.3 mg/dL (ref 0.0–1.2)
CO2: 26 mmol/L (ref 20–29)
Calcium: 10 mg/dL (ref 8.7–10.3)
Chloride: 103 mmol/L (ref 96–106)
Creatinine, Ser: 1.03 mg/dL — ABNORMAL HIGH (ref 0.57–1.00)
Globulin, Total: 1.8 g/dL (ref 1.5–4.5)
Glucose: 133 mg/dL — ABNORMAL HIGH (ref 70–99)
Potassium: 4.6 mmol/L (ref 3.5–5.2)
Sodium: 143 mmol/L (ref 134–144)
Total Protein: 6.3 g/dL (ref 6.0–8.5)
eGFR: 60 mL/min/{1.73_m2} (ref >59)

## 2021-11-17 LAB — LIPID PANEL
Chol/HDL Ratio: 2 ratio (ref 0.0–4.4)
Cholesterol, Total: 236 mg/dL — ABNORMAL HIGH (ref 100–199)
HDL: 120 mg/dL (ref >39)
LDL Chol Calc (NIH): 108 mg/dL — ABNORMAL HIGH (ref 0–99)
Triglycerides: 45 mg/dL (ref 0–149)
VLDL Cholesterol Cal: 8 mg/dL (ref 5–40)

## 2021-12-06 ENCOUNTER — Telehealth: Payer: Self-pay | Admitting: *Deleted

## 2021-12-06 NOTE — Chronic Care Management (AMB) (Signed)
  Chronic Care Management   Note  12/06/2021 Name: Alpa Salvo MRN: [id number] DOB: Nov 13, 1955  Gweneth Fredlund is a 66 y.o. year old female who is a primary care patient of Glendale Chard, MD. I reached out to [employer] by phone today in response to a referral sent by Ms. Tyronda Chovan's PCP.  Ms. Edgley was given information about Chronic Care Management services today including:  CCM service includes personalized support from designated clinical staff supervised by her physician, including individualized plan of care and coordination with other care providers 24/7 contact phone numbers for assistance for urgent and routine care needs. Service will only be billed when office clinical staff spend 20 minutes or more in a month to coordinate care. Only one practitioner may furnish and bill the service in a calendar month. The patient may stop CCM services at any time (effective at the end of the month) by phone call to the office staff. The patient is responsible for co-pay (up to 20% after annual deductible is met) if co-pay is required by the individual health plan.   Patient agreed to services and verbal consent obtained.   Follow up plan: Telephone appointment with care management team member scheduled for:12/13/21  [place]  Direct Dial: [phone]

## 2021-12-13 ENCOUNTER — Ambulatory Visit: Payer: Self-pay

## 2021-12-13 NOTE — Chronic Care Management (AMB) (Signed)
Error

## 2021-12-13 NOTE — Patient Instructions (Signed)
Visit Information  Thank you for taking time to visit with me today. Please don't hesitate to contact me if I can be of assistance to you.   Following are the goals we discussed today:   Goals Addressed               This Visit's Progress     Patient Stated     I want resources to help with home repair (pt-stated)        Care Coordination Interventions: Discussed the patient needs flooring in bathroom replaced as well as gutter replacement Reviewed patient cannot afford these home repairs on her own Screened patient to determine she does own her home and may qualify for assistance from [employer] Placed referral to [employer] via [place]         Our next appointment is by telephone on 8/14 at 10:00 am  Please call the care guide team at [phone] if you need to cancel or reschedule your appointment.   If you are experiencing a Mental Health or Behavioral Health Crisis or need someone to talk to, please go to [facility] [address], [place] [phone])   The patient verbalized understanding of instructions, educational materials, and care plan provided today and DECLINED offer to receive copy of patient instructions, educational materials, and care plan.   Telephone follow up appointment with care management team member scheduled CBS:[id number]  Bevelyn Ngo, [place], CDP Social Worker, Certified Dementia Practitioner Care Coordination [phone]

## 2021-12-13 NOTE — Patient Outreach (Signed)
  Care Coordination   Initial Visit Note   12/13/2021 Name: Samantha Gomez MRN: [id number] DOB: 1955/10/24  Samantha Gomez is a 66 y.o. year old female who sees Dorothyann Peng, MD for primary care. I spoke with  Librarian, academic by phone today  What matters to the patients health and wellness today?  Resources for home repair   Goals Addressed               This Visit's Progress     Patient Stated     I want resources to help with home repair (pt-stated)        Care Coordination Interventions: Discussed the patient needs flooring in bathroom replaced as well as gutter replacement Reviewed patient cannot afford these home repairs on her own Screened patient to determine she does own her home and may qualify for assistance from [employer] Placed referral to [employer] via [place]         SDOH assessments and interventions completed:   Yes SDOH Interventions Today    Flowsheet Row Most Recent Value  SDOH Interventions   Food Insecurity Interventions Intervention Not Indicated  Housing Interventions Other (Comment), [facility] Referral  [Referred to [employer] for assistance with gutter and flooring repair]  Transportation Interventions Intervention Not Indicated       Care Coordination Interventions Activated:  Yes Care Coordination Interventions:  Yes, provided  Follow up plan: Referral made to [employer]. Follow up call scheduled to assess outcome of referral.  Encounter Outcome:  Pt. Visit Completed  Bevelyn Ngo, BSW, CDP Social Worker, Certified Dementia Practitioner Care Coordination [phone]

## 2022-01-03 ENCOUNTER — Ambulatory Visit: Payer: Self-pay

## 2022-01-03 NOTE — Patient Outreach (Signed)
  Care Coordination   Follow Up Visit Note   01/03/2022 Name: Ryanne Morand MRN: [id number] DOB: Dec 28, 1955  Zenia Guest is a 66 y.o. year old female who sees Dorothyann Peng, MD for primary care. I spoke with  Librarian, academic by phone today  What matters to the patients health and wellness today?  To obtain home repairs    Goals Addressed               This Visit's Progress     Patient Stated     I want resources to help with home repair (pt-stated)        Care Coordination Interventions: Telephonic visit completed with the patient to assess goal progression Discussed the patient has not yet heard from [facility] regarding repairs to gutters and bathroom floor Advised the patient SW would follow up with [facility] on status of patient referral Reviewed [facility] to note patients referral has not yet been acted on Contacted [facility] referral coordinator Rod Holler via e-mail requesting feedback on the status of patients referral        SDOH assessments and interventions completed:  No     Care Coordination Interventions Activated:  Yes  Care Coordination Interventions:  Yes, provided   Follow up plan:  SW will follow up with [facility] by phone on Friday August 18 if a response is not received prior to this date.    Encounter Outcome:  Pt. Visit Completed   Bevelyn Ngo, BSW, CDP Social Worker, Certified Dementia Practitioner Care Coordination [phone]

## 2022-01-03 NOTE — Patient Instructions (Signed)
Visit Information  Thank you for taking time to visit with me today. Please don't hesitate to contact me if I can be of assistance to you.   Following are the goals we discussed today:   Goals Addressed               This Visit's Progress     Patient Stated     I want resources to help with home repair (pt-stated)        Care Coordination Interventions: Telephonic visit completed with the patient to assess goal progression Discussed the patient has not yet heard from [facility] regarding repairs to gutters and bathroom floor Advised the patient SW would follow up with [facility] on status of patient referral Reviewed [facility] to note patients referral has not yet been acted on Contacted [facility] referral coordinator Rod Holler via e-mail requesting feedback on the status of patients referral         If you are experiencing a Mental Health or Behavioral Health Crisis or need someone to talk to, please call 1-800-273-TALK (toll free, 24 hour hotline)  The patient verbalized understanding of instructions, educational materials, and care plan provided today and DECLINED offer to receive copy of patient instructions, educational materials, and care plan.   SW will contact the patient once a response is received from [facility] regarding referral status  Bevelyn Ngo, BSW, CDP Social Worker, Certified Dementia Practitioner Care Coordination [phone]

## 2022-01-07 ENCOUNTER — Ambulatory Visit: Payer: Self-pay

## 2022-01-07 NOTE — Patient Outreach (Signed)
  Care Coordination   Follow Up Visit Note   01/07/2022 Name: Samantha Gomez MRN: [id number] DOB: 08/22/1955  Samantha Gomez is a 66 y.o. year old female who sees Samantha Peng, MD for primary care. I  collaborated with [facility] today.  What matters to the patients health and wellness today?  To obtain home repairs    Goals Addressed               This Visit's Progress     Patient Stated     I want resources to help with home repair (pt-stated)        Care Coordination Interventions: Collaboration with Samantha Gomez with [employer] who indicates she attempted to contact the patient but was unsuccesful and unable to leave a voice message Unsuccessful outbound call placed to the patient, voice mailbox remains full SW will attempt to contact the patient over the next 10 days        SDOH assessments and interventions completed:  No     Care Coordination Interventions Activated:  Yes  Care Coordination Interventions:  Yes, provided   Follow up plan:  SW will continue to follow    Encounter Outcome:  Pt. Visit Completed   Samantha Gomez, BSW, CDP Social Worker, Certified Dementia Practitioner Care Coordination [phone]

## 2022-01-12 ENCOUNTER — Ambulatory Visit: Payer: Self-pay

## 2022-01-12 NOTE — Patient Outreach (Signed)
  Care Coordination   Follow Up Visit Note   01/12/2022 Name: Samantha Gomez MRN: [id number] DOB: 1955-09-21  Samantha Gomez is a 66 y.o. year old female who sees Dorothyann Peng, MD for primary care. I spoke with  Librarian, academic by phone today  What matters to the patients health and wellness today?  To obtain home repairs    Goals Addressed               This Visit's Progress     Patient Stated     COMPLETED: I want resources to help with home repair (pt-stated)        Care Coordination Interventions: Discussed patients voice mailbox is full and Annabelle Harman with [employer] was unable to leave a message when she called Provided the patient with contact number to [employer] advising this is a Chief Technology Officer and she may have to call more than once to get connected with Annabelle Harman  Encouraged the patient to contact SW as needed        SDOH assessments and interventions completed:  No     Care Coordination Interventions Activated:  Yes  Care Coordination Interventions:  Yes, provided   Follow up plan: No further intervention required. Referral made to [employer]    Encounter Outcome:  Pt. Visit Completed   Bevelyn Ngo, BSW, CDP Social Worker, Certified Dementia Practitioner Care Coordination [phone]

## 2022-01-12 NOTE — Patient Instructions (Signed)
Visit Information  Thank you for taking time to visit with me today. Please don't hesitate to contact me if I can be of assistance to you.   Following are the goals we discussed today:   Goals Addressed               This Visit's Progress     Patient Stated     COMPLETED: I want resources to help with home repair (pt-stated)        Care Coordination Interventions: Discussed patients voice mailbox is full and Annabelle Harman with [employer] was unable to leave a message when she called Provided the patient with contact number to [employer] advising this is a Chief Technology Officer and she may have to call more than once to get connected with Annabelle Harman  Encouraged the patient to contact SW as needed        Please call the care guide team at [phone] if you need to schedule an appointment with me  If you are experiencing a Mental Health or Behavioral Health Crisis or need someone to talk to, please call 1-800-273-TALK (toll free, 24 hour hotline)  The patient verbalized understanding of instructions, educational materials, and care plan provided today and DECLINED offer to receive copy of patient instructions, educational materials, and care plan.   No further follow up required: Please contact Annabelle Harman with [employer].  Bevelyn Ngo, BSW, CDP Social Worker, Certified Dementia Practitioner Care Coordination [phone]

## 2022-01-12 NOTE — Patient Outreach (Signed)
  Care Coordination   01/12/2022 Name: Samantha Gomez MRN: [id number] DOB: 1956/04/30   Care Coordination Outreach Attempts:  An unsuccessful telephone outreach was attempted today to offer the patient information about available care coordination services as a benefit of their health plan.   Follow Up Plan:  Additional outreach attempts will be made to offer the patient care coordination information and services.   Encounter Outcome:  No Answer  Care Coordination Interventions Activated:  No   Care Coordination Interventions:  No, not indicated    Bevelyn Ngo, BSW, CDP Social Worker, Certified Dementia Practitioner Care Coordination [phone]

## 2022-01-17 DIAGNOSIS — Z1231 Encounter for screening mammogram for malignant neoplasm of breast: Secondary | ICD-10-CM | POA: Diagnosis not present

## 2022-01-17 LAB — HM MAMMOGRAPHY: HM Mammogram: NORMAL (ref 0–4)

## 2022-03-21 ENCOUNTER — Ambulatory Visit (INDEPENDENT_AMBULATORY_CARE_PROVIDER_SITE_OTHER): Payer: Medicare PPO | Admitting: Internal Medicine

## 2022-03-21 ENCOUNTER — Encounter: Payer: Self-pay | Admitting: Internal Medicine

## 2022-03-21 VITALS — BP 124/72 | HR 95 | Temp 98.1°F | Ht 64.0 in | Wt 161.6 lb

## 2022-03-21 DIAGNOSIS — I8391 Asymptomatic varicose veins of right lower extremity: Secondary | ICD-10-CM | POA: Diagnosis not present

## 2022-03-21 DIAGNOSIS — Z23 Encounter for immunization: Secondary | ICD-10-CM | POA: Diagnosis not present

## 2022-03-21 DIAGNOSIS — R7309 Other abnormal glucose: Secondary | ICD-10-CM

## 2022-03-21 DIAGNOSIS — I10 Essential (primary) hypertension: Secondary | ICD-10-CM | POA: Diagnosis not present

## 2022-03-21 DIAGNOSIS — F17200 Nicotine dependence, unspecified, uncomplicated: Secondary | ICD-10-CM | POA: Diagnosis not present

## 2022-03-21 DIAGNOSIS — Z6827 Body mass index (BMI) 27.0-27.9, adult: Secondary | ICD-10-CM | POA: Diagnosis not present

## 2022-03-21 NOTE — Progress Notes (Signed)
Rich Brave [place]leton,acting as a Education administrator for Maximino Greenland, MD.,have documented all relevant documentation on the behalf of Maximino Greenland, MD,as directed by  Maximino Greenland, MD while in the presence of Maximino Greenland, MD.    Subjective:     Patient ID: Samantha Gomez , female    DOB: 1956/01/16 , 66 y.o.   MRN: [id number]   Chief Complaint  Patient presents with   Hypertension    HPI  She is here today for HTN check. Patient reports compliance with her meds. She denies headaches, chest pain and shortness of breath.  She is happy to report she is now exercising regularly. She does not have any leg pain or sob with walking.   Hypertension This is a chronic problem. The current episode started more than 1 year ago. The problem is controlled. Pertinent negatives include no blurred vision. Risk factors for coronary artery disease include smoking/tobacco exposure, stress and post-menopausal state. The current treatment provides moderate improvement. Compliance problems include exercise.      Past Medical History:  Diagnosis Date   Allergy    Benign hypertensive heart disease    Chronic kidney disease    Disorder of nasal cavity    Hypertension      Family History  Problem Relation Age of Onset   Dementia Mother    Lung cancer Father      Current Outpatient Medications:    b complex vitamins tablet, Take 1 tablet by mouth daily., Disp: , Rfl:    Cholecalciferol (VITAMIN D-3) 5000 units TABS, Take by mouth daily., Disp: , Rfl:    niacin 250 MG tablet, Take 250 mg by mouth daily., Disp: , Rfl:    TRIBENZOR 40-5-25 MG TABS, Take 1 tablet by mouth daily., Disp: 90 tablet, Rfl: 2   No Known Allergies   Review of Systems  Constitutional: Negative.   Eyes: Negative.  Negative for blurred vision.  Respiratory: Negative.    Cardiovascular: Negative.        She c/o painful varicose veins, located in RLE. Pain is intermittent, unable to determine what is triggering her sx.    Musculoskeletal: Negative.   Skin: Negative.   Psychiatric/Behavioral: Negative.       Today's Vitals   03/21/22 0935  BP: 124/72  Pulse: 95  Temp: 98.1 F (36.7 C)  Weight: 161 lb 9.6 oz (73.3 kg)  Height: _0  (1.626 m)  PainSc: 0-No pain   Body mass index is 27.74 kg/m.  Wt Readings from Last 3 Encounters:  03/21/22 161 lb 9.6 oz (73.3 kg)  11/16/21 163 lb 3.2 oz (74 kg)  05/13/21 164 lb (74.4 kg)     Objective:  Physical Exam Vitals and nursing note reviewed.  Constitutional:      Appearance: Normal appearance.  HENT:     Head: Normocephalic and atraumatic.     Nose:     Comments: Masked     Mouth/Throat:     Comments: Masked  Eyes:     Extraocular Movements: Extraocular movements intact.  Cardiovascular:     Rate and Rhythm: Normal rate and regular rhythm.     Heart sounds: Normal heart sounds.  Pulmonary:     Effort: Pulmonary effort is normal.     Breath sounds: Normal breath sounds.  Musculoskeletal:     Cervical back: Normal range of motion.  Skin:    General: Skin is warm.  Neurological:     General: No focal deficit present.  Mental Status: She is alert.  Psychiatric:        Mood and Affect: Mood normal.        Behavior: Behavior normal.      Assessment And Plan:     1. Essential hypertension, benign Comments: Chronic, well controlled. She will c/w Tribenzor 40/5/25mg once daily.  She will rto in Jan 2024 for her next physical examination.  - BMP8+eGFR  2. Varicose veins of right calf - Ambulatory referral to Vascular Surgery  3. Other abnormal glucose Comments: BS 133 at last visit. I will check her a1c today.  - Hemoglobin A1c  4. Tobacco use disorder Comments: She is still smoking, risks d/w patient. She declines rx meds and low dose CT screening at this time.   5. BMI 27.0-27.9,adult Comments: She is encouraged to aim for at least 150 minutes of exercise per week.   6. Immunization due Comments: She agreed to Shingrix,  this will be billed via [employer]. This will complete her series.  - Varicella-zoster vaccine IM   Patient was given opportunity to ask questions. Patient verbalized understanding of the plan and was able to repeat key elements of the plan. All questions were answered to their satisfaction.   I, Maximino Greenland, MD, have reviewed all documentation for this visit. The documentation on 03/21/22 for the exam, diagnosis, procedures, and orders are all accurate and complete.   IF YOU HAVE BEEN REFERRED TO A SPECIALIST, IT MAY TAKE 1-2 WEEKS TO SCHEDULE/PROCESS THE REFERRAL. IF YOU HAVE NOT HEARD FROM US/SPECIALIST IN TWO WEEKS, PLEASE GIVE [place] A CALL AT [phone].   THE PATIENT IS ENCOURAGED TO PRACTICE SOCIAL DISTANCING DUE TO THE COVID-19 PANDEMIC.

## 2022-03-21 NOTE — Progress Notes (Deleted)
  Barnet Glasgow Almon Whitford,acting as a Education administrator for Maximino Greenland, MD.,have documented all relevant documentation on the behalf of Maximino Greenland, MD,as directed by  Maximino Greenland, MD while in the presence of Maximino Greenland, MD.    Subjective:     Patient ID: Samantha Gomez , female    DOB: November 24, 1955 , 66 y.o.   MRN: [id number]   No chief complaint on file.   HPI  HPI   Past Medical History:  Diagnosis Date   Allergy    Benign hypertensive heart disease    Chronic kidney disease    Disorder of nasal cavity    Hypertension      Family History  Problem Relation Age of Onset   Dementia Mother    Lung cancer Father      Current Outpatient Medications:    b complex vitamins tablet, Take 1 tablet by mouth daily., Disp: , Rfl:    Cholecalciferol (VITAMIN D-3) 5000 units TABS, Take by mouth daily., Disp: , Rfl:    niacin 250 MG tablet, Take 250 mg by mouth daily., Disp: , Rfl:    TRIBENZOR 40-5-25 MG TABS, Take 1 tablet by mouth daily., Disp: 90 tablet, Rfl: 2   No Known Allergies   Review of Systems   There were no vitals filed for this visit. There is no height or weight on file to calculate BMI.   Objective:  Physical Exam      Assessment And Plan:     There are no diagnoses linked to this encounter.    Patient was given opportunity to ask questions. Patient verbalized understanding of the plan and was able to repeat key elements of the plan. All questions were answered to their satisfaction.  Tonie Griffith, [place], Tonie Griffith, CMA, have reviewed all documentation for this visit. The documentation on 03/21/22 for the exam, diagnosis, procedures, and orders are all accurate and complete.   IF YOU HAVE BEEN REFERRED TO A SPECIALIST, IT MAY TAKE 1-2 WEEKS TO SCHEDULE/PROCESS THE REFERRAL. IF YOU HAVE NOT HEARD FROM US/SPECIALIST IN TWO WEEKS, PLEASE GIVE [place] A CALL AT [phone].   THE PATIENT IS ENCOURAGED TO PRACTICE SOCIAL DISTANCING DUE TO THE COVID-19  PANDEMIC.

## 2022-03-21 NOTE — Patient Instructions (Addendum)
The ASCVD Risk score (Arnett DK, et al., 2019) failed to calculate for the following reasons:   The valid HDL cholesterol range is 20 to 100 mg/dL  [facility] Interested in laser therapy [phone]   Hypertension, Adult Hypertension is another name for high blood pressure. High blood pressure forces your heart to work harder to pump blood. This can cause problems over time. There are two numbers in a blood pressure reading. There is a top number (systolic) over a bottom number (diastolic). It is best to have a blood pressure that is below 120/80. What are the causes? The cause of this condition is not known. Some other conditions can lead to high blood pressure. What increases the risk? Some lifestyle factors can make you more likely to develop high blood pressure: Smoking. Not getting enough exercise or physical activity. Being overweight. Having too much fat, sugar, calories, or salt (sodium) in your diet. Drinking too much alcohol. Other risk factors include: Having any of these conditions: Heart disease. Diabetes. High cholesterol. Kidney disease. Obstructive sleep apnea. Having a family history of high blood pressure and high cholesterol. Age. The risk increases with age. Stress. What are the signs or symptoms? High blood pressure may not cause symptoms. Very high blood pressure (hypertensive crisis) may cause: Headache. Fast or uneven heartbeats (palpitations). Shortness of breath. Nosebleed. Vomiting or feeling like you may vomit (nauseous). Changes in how you see. Very bad chest pain. Feeling dizzy. Seizures. How is this treated? This condition is treated by making healthy lifestyle changes, such as: Eating healthy foods. Exercising more. Drinking less alcohol. Your doctor may prescribe medicine if lifestyle changes do not help enough and if: Your top number is above 130. Your bottom number is above 80. Your personal target blood pressure may  vary. Follow these instructions at home: Eating and drinking  If told, follow the DASH eating plan. To follow this plan: Fill one half of your plate at each meal with fruits and vegetables. Fill one fourth of your plate at each meal with whole grains. Whole grains include whole-wheat pasta, brown rice, and whole-grain bread. Eat or drink low-fat dairy products, such as skim milk or low-fat yogurt. Fill one fourth of your plate at each meal with low-fat (lean) proteins. Low-fat proteins include fish, chicken without skin, eggs, beans, and tofu. Avoid fatty meat, cured and processed meat, or chicken with skin. Avoid pre-made or processed food. Limit the amount of salt in your diet to less than 1,500 mg each day. Do not drink alcohol if: Your doctor tells you not to drink. You are pregnant, may be pregnant, or are planning to become pregnant. If you drink alcohol: Limit how much you have to: 0-1 drink a day for women. 0-2 drinks a day for men. Know how much alcohol is in your drink. In the [place] one drink equals one 12 oz bottle of beer (355 mL), one 5 oz glass of wine (148 mL), or one 1 oz glass of hard liquor (44 mL). Lifestyle  Work with your doctor to stay at a healthy weight or to lose weight. Ask your doctor what the best weight is for you. Get at least 30 minutes of exercise that causes your heart to beat faster (aerobic exercise) most days of the week. This may include walking, swimming, or biking. Get at least 30 minutes of exercise that strengthens your muscles (resistance exercise) at least 3 days a week. This may include lifting weights or doing Pilates. Do not smoke  or use any products that contain nicotine or tobacco. If you need help quitting, ask your doctor. Check your blood pressure at home as told by your doctor. Keep all follow-up visits. Medicines Take over-the-counter and prescription medicines only as told by your doctor. Follow directions carefully. Do not skip  doses of blood pressure medicine. The medicine does not work as well if you skip doses. Skipping doses also puts you at risk for problems. Ask your doctor about side effects or reactions to medicines that you should watch for. Contact a doctor if: You think you are having a reaction to the medicine you are taking. You have headaches that keep coming back. You feel dizzy. You have swelling in your ankles. You have trouble with your vision. Get help right away if: You get a very bad headache. You start to feel mixed up (confused). You feel weak or numb. You feel faint. You have very bad pain in your: Chest. Belly (abdomen). You vomit more than once. You have trouble breathing. These symptoms may be an emergency. Get help right away. Call 911. Do not wait to see if the symptoms will go away. Do not drive yourself to the hospital. Summary Hypertension is another name for high blood pressure. High blood pressure forces your heart to work harder to pump blood. For most people, a normal blood pressure is less than 120/80. Making healthy choices can help lower blood pressure. If your blood pressure does not get lower with healthy choices, you may need to take medicine. This information is not intended to replace advice given to you by your health care provider. Make sure you discuss any questions you have with your health care provider. Document Revised: 02/25/2021 Document Reviewed: 02/25/2021 Elsevier Patient Education  2023 [employer].

## 2022-04-26 ENCOUNTER — Other Ambulatory Visit: Payer: Self-pay | Admitting: *Deleted

## 2022-04-26 DIAGNOSIS — M79604 Pain in right leg: Secondary | ICD-10-CM

## 2022-05-03 ENCOUNTER — Ambulatory Visit (HOSPITAL_COMMUNITY)
Admission: RE | Admit: 2022-05-03 | Discharge: 2022-05-03 | Disposition: A | Discharge status: Home / Self Care | Payer: Medicare PPO | Source: Ambulatory Visit | Attending: Vascular Surgery | Admitting: Vascular Surgery

## 2022-05-03 ENCOUNTER — Other Ambulatory Visit: Payer: Medicare PPO

## 2022-05-03 ENCOUNTER — Ambulatory Visit (INDEPENDENT_AMBULATORY_CARE_PROVIDER_SITE_OTHER): Payer: Medicare PPO | Admitting: Physician Assistant

## 2022-05-03 VITALS — BP 152/85 | HR 87 | Temp 97.3°F | Resp 20 | Ht 64.0 in | Wt 160.6 lb

## 2022-05-03 DIAGNOSIS — I872 Venous insufficiency (chronic) (peripheral): Secondary | ICD-10-CM

## 2022-05-03 DIAGNOSIS — M79604 Pain in right leg: Secondary | ICD-10-CM | POA: Diagnosis not present

## 2022-05-03 DIAGNOSIS — I10 Essential (primary) hypertension: Secondary | ICD-10-CM | POA: Diagnosis not present

## 2022-05-03 DIAGNOSIS — R7309 Other abnormal glucose: Secondary | ICD-10-CM | POA: Diagnosis not present

## 2022-05-03 DIAGNOSIS — I8393 Asymptomatic varicose veins of bilateral lower extremities: Secondary | ICD-10-CM

## 2022-05-03 NOTE — Progress Notes (Signed)
[facility] [place]   Reason for referral: Swollen right leg  History of Present Illness  Samantha Gomez is a 66 y.o. female who presents with chief complaint: swollen leg.  Patient notes, onset of swelling  months ago, associated with prolonged sitting and standing.  The patient has had no history of DVT, no history of varicose vein, positive history of venous stasis ulcers, no history of  Lymphedema and no history of skin changes in lower legs.  There is unknown family history of venous disorders.  The patient has not used compression stockings in the past.  She states she stays very active.  She was just concerned when she satrted to have swelling and viable varicose veins behind the knee.   Past Medical History:  Diagnosis Date   Allergy    Benign hypertensive heart disease    Chronic kidney disease    Disorder of nasal cavity    Hypertension     No past surgical history on file.  Social History   Socioeconomic History   Marital status: Single    Spouse name: Not on file   Number of children: Not on file   Years of education: Not on file   Highest education level: Not on file  Occupational History   Not on file  Tobacco Use   Smoking status: Heavy Smoker    Packs/day: 0.75    Years: 20.00    Total pack years: 15.00    Types: Cigarettes    Start date: 10/07/1969    Passive exposure: Current   Smokeless tobacco: Never   Tobacco comments:    1ppdx20 years, plus 3/4ppdx 15   Vaping Use   Vaping Use: Never used  Substance and Sexual Activity   Alcohol use: Yes    Alcohol/week: 2.0 standard drinks of alcohol    Types: 2 Shots of liquor per week   Drug use: Never   Sexual activity: Not Currently  Other Topics Concern   Not on file  Social History Narrative   Not on file   Social Determinants of Health   Financial Resource Strain: Low Risk  (05/13/2021)   Overall Financial Resource Strain (CARDIA)    Difficulty of Paying Living Expenses: Not  hard at all  Food Insecurity: No Food Insecurity (12/13/2021)   Hunger Vital Sign    Worried About Running Out of Food in the Last Year: Never true    Ran Out of Food in the Last Year: Never true  Transportation Needs: No Transportation Needs (12/13/2021)   PRAPARE - Administrator, Civil ServiceTransportation    Lack of Transportation (Medical): No    Lack of Transportation (Non-Medical): No  Physical Activity: Insufficiently Active (05/13/2021)   Exercise Vital Sign    Days of Exercise per Week: 7 days    Minutes of Exercise per Session: 20 min  Stress: No Stress Concern Present (05/13/2021)   [employer] - Occupational Stress Questionnaire    Feeling of Stress : Only a little  Social Connections: Not on file  Intimate Partner Violence: Not on file    Family History  Problem Relation Age of Onset   Dementia Mother    Lung cancer Father     Current Outpatient Medications on File Prior to Visit  Medication Sig Dispense Refill   b complex vitamins tablet Take 1 tablet by mouth daily.     Cholecalciferol (VITAMIN D-3) 5000 units TABS Take by mouth daily.     niacin 250 MG tablet  Take 250 mg by mouth daily.     TRIBENZOR 40-5-25 MG TABS Take 1 tablet by mouth daily. 90 tablet 2   No current facility-administered medications on file prior to visit.    Allergies as of 05/03/2022   (No Known Allergies)     ROS:   General:  No weight loss, Fever, chills  HEENT: No recent headaches, no nasal bleeding, no visual changes, no sore throat  Neurologic: No dizziness, blackouts, seizures. No recent symptoms of stroke or mini- stroke. No recent episodes of slurred speech, or temporary blindness.  Cardiac: No recent episodes of chest pain/pressure, no shortness of breath at rest.  No shortness of breath with exertion.  Denies history of atrial fibrillation or irregular heartbeat  Vascular: No history of rest pain in feet.  No history of claudication.  No history of non-healing ulcer,  No history of DVT   Pulmonary: No home oxygen, no productive cough, no hemoptysis,  No asthma or wheezing  Musculoskeletal:  [ ]  Arthritis, [ ]  Low back pain,  [ ]  Joint pain  Hematologic:No history of hypercoagulable state.  No history of easy bleeding.  No history of anemia  Gastrointestinal: No hematochezia or melena,  No gastroesophageal reflux, no trouble swallowing  Urinary: [ ]  chronic Kidney disease, [ ]  on HD - [ ]  MWF or [ ]  TTHS, [ ]  Burning with urination, [ ]  Frequent urination, [ ]  Difficulty urinating;   Skin: No rashes  Psychological: No history of anxiety,  No history of depression  Physical Examination  Vitals:   05/03/22 1429  BP: (!) 152/85  Pulse: 87  Resp: 20  Temp: (!) 97.3 F (36.3 C)  TempSrc: Temporal  SpO2: 98%  Weight: 160 lb 9.6 oz (72.8 kg)  Height: 5\' 4"  (1.626 m)    Body mass index is 27.57 kg/m.  General:  Alert and oriented, no acute distress HEENT: Normal Neck: No bruit or JVD Pulmonary: Clear to auscultation bilaterally Cardiac: Regular Rate and Rhythm without murmur Abdomen: Soft, non-tender, non-distended, no mass, no scars Skin: No rash Extremity Pulses:   radial,femoral, dorsalis pedis,  pulses bilaterally Musculoskeletal: No deformity or edema  Neurologic: Upper and lower extremity motor 5/5 and symmetric  DATA: Venous Reflux Times  +---------------------+---------+------+-----------+------------+----------  ----+  RIGHT               Reflux NoRefluxReflux TimeDiameter cmsComments                                        Yes                                          +---------------------+---------+------+-----------+------------+----------  ----+  CFV                 no                                                     +---------------------+---------+------+-----------+------------+----------  ----+  FV prox              no                                                      +---------------------+---------+------+-----------+------------+----------  ----+  FV mid               no                                                     +---------------------+---------+------+-----------+------------+----------  ----+  FV dist              no                                                     +---------------------+---------+------+-----------+------------+----------  ----+  Popliteal           no                                                     +---------------------+---------+------+-----------+------------+----------  ----+  GSV at [facility]                     yes    >500 ms     0.666                     +---------------------+---------+------+-----------+------------+----------  ----+  GSV prox thigh                 yes    >500 ms     0.523                     +---------------------+---------+------+-----------+------------+----------  ----+  GSV mid thigh                  yes    >500 ms     0.456                     +---------------------+---------+------+-----------+------------+----------  ----+  GSV dist thigh                 yes    >500 ms     0.451                     +---------------------+---------+------+-----------+------------+----------  ----+  GSV at knee          no                           0.338                     +---------------------+---------+------+-----------+------------+----------  ----+  GSV prox calf                                              small  branches  +---------------------+---------+------+-----------+------------+----------  ----+  SSV Pop Fossa        no                           0.178                     +---------------------+---------+------+-----------+------------+----------  ----+  SSV prox calf        no                           0.155                      +---------------------+---------+------+-----------+------------+----------  ----+  SSV mid calf         no                           0.187                     +---------------------+---------+------+-----------+------------+----------  ----+  Varicose vein                  yes    >500 ms              tortuous         posterior distal                                                            thigh                                                                       +---------------------+---------+------+-----------+------------+----------  ----+        Summary:  Right:  Thrombus  - No evidence of deep vein thrombosis seen in the right lower extremity,  from the common femoral through the popliteal veins.  - No evidence of superficial venous thrombosis in the right lower  extremity.   Deep veins  - No evidence of deep vein reflux.  Superficial veins.  - Reflux in the SFJ and GSV to the distal thigh.  - Reflux in varicose veins of the distal posterior thigh.             Assessment/Plan: Venous reflux right LE Symptoms of mild swelling and heaviness with varicose veins The venous duplex is negative for DVT or deep reflux.  She has reflux from  and including the SFJ to the popliteal.  The vein size in > 0.4 cm in the area of reflux.  She will be placed in thigh 20-30 mm hg compression, elevation and continued exercise.  I will have her follow up in 3 months in the vein clinic.  We will perform a reflux study on the left LE when she returns.  She will be considered for laser ablation therapy.      Mosetta Pigeon PA-C [facility] [place] Office: [phone]  MD in clinic [place]

## 2022-05-04 LAB — BMP8+EGFR
BUN/Creatinine Ratio: 18 (ref 12–28)
BUN: 16 mg/dL (ref 8–27)
CO2: 21 mmol/L (ref 20–29)
Calcium: 9.6 mg/dL (ref 8.7–10.3)
Chloride: 100 mmol/L (ref 96–106)
Creatinine, Ser: 0.91 mg/dL (ref 0.57–1.00)
Glucose: 99 mg/dL (ref 70–99)
Potassium: 3.9 mmol/L (ref 3.5–5.2)
Sodium: 140 mmol/L (ref 134–144)
eGFR: 69 mL/min/{1.73_m2} (ref >59)

## 2022-05-04 LAB — HEMOGLOBIN A1C
Est. average glucose Bld gHb Est-mCnc: 111 mg/dL
Hgb A1c MFr Bld: 5.5 % (ref 4.8–5.6)

## 2022-05-17 ENCOUNTER — Other Ambulatory Visit: Payer: Self-pay

## 2022-05-17 DIAGNOSIS — I872 Venous insufficiency (chronic) (peripheral): Secondary | ICD-10-CM

## 2022-06-07 DIAGNOSIS — I10 Essential (primary) hypertension: Secondary | ICD-10-CM | POA: Diagnosis not present

## 2022-06-07 DIAGNOSIS — Z1211 Encounter for screening for malignant neoplasm of colon: Secondary | ICD-10-CM | POA: Diagnosis not present

## 2022-06-07 DIAGNOSIS — K5730 Diverticulosis of large intestine without perforation or abscess without bleeding: Secondary | ICD-10-CM | POA: Diagnosis not present

## 2022-06-07 DIAGNOSIS — Z86010 Personal history of colonic polyps: Secondary | ICD-10-CM | POA: Diagnosis not present

## 2022-06-08 ENCOUNTER — Ambulatory Visit: Payer: Medicare PPO

## 2022-06-08 ENCOUNTER — Ambulatory Visit (INDEPENDENT_AMBULATORY_CARE_PROVIDER_SITE_OTHER): Payer: Medicare PPO

## 2022-06-08 VITALS — BP 124/62 | HR 95 | Temp 98.3°F | Ht 64.0 in | Wt 161.6 lb

## 2022-06-08 DIAGNOSIS — Z0000 Encounter for general adult medical examination without abnormal findings: Secondary | ICD-10-CM

## 2022-06-08 NOTE — Progress Notes (Signed)
Subjective:   Samantha Gomez is a 67 y.o. female who presents for Medicare Annual (Subsequent) preventive examination.  Review of Systems     Cardiac Risk Factors include: advanced age (>73men, >77 women);hypertension;smoking/ tobacco exposure     Objective:    Today's Vitals   06/08/22 1438 06/08/22 1443  BP: 124/62   Pulse: 95   Temp: 98.3 F (36.8 C)   TempSrc: Oral   SpO2: 96%   Weight: 161 lb 9.6 oz (73.3 kg)   Height: 5\' 4"  (1.626 m)   PainSc:  6    Body mass index is 27.74 kg/m.     06/08/2022    2:47 PM 05/13/2021   10:03 AM 04/28/2020   12:05 PM 04/28/2020   11:15 AM  Advanced Directives  Does Patient Have a Medical Advance Directive? No No No No  Would patient like information on creating a medical advance directive? No - Patient declined  Yes (MAU/Ambulatory/Procedural Areas - Information given) Yes (ED - Information included in AVS)    Current Medications (verified) Outpatient Encounter Medications as of 06/08/2022  Medication Sig   b complex vitamins tablet Take 1 tablet by mouth daily.   Cholecalciferol (VITAMIN D-3) 5000 units TABS Take by mouth daily.   niacin 250 MG tablet Take 250 mg by mouth daily.   TRIBENZOR 40-5-25 MG TABS Take 1 tablet by mouth daily.   No facility-administered encounter medications on file as of 06/08/2022.    Allergies (verified) Patient has no known allergies.   History: Past Medical History:  Diagnosis Date   Allergy    Benign hypertensive heart disease    Chronic kidney disease    Disorder of nasal cavity    Hypertension    History reviewed. No pertinent surgical history. Family History  Problem Relation Age of Onset   Dementia Mother    Lung cancer Father    Social History   Socioeconomic History   Marital status: Single    Spouse name: Not on file   Number of children: Not on file   Years of education: Not on file   Highest education level: Not on file  Occupational History   Not on file  Tobacco  Use   Smoking status: Heavy Smoker    Packs/day: 0.75    Years: 20.00    Total pack years: 15.00    Types: Cigarettes    Start date: 10/07/1969    Passive exposure: Current   Smokeless tobacco: Never   Tobacco comments:    1ppdx20 years, plus 3/4ppdx 15   Vaping Use   Vaping Use: Never used  Substance and Sexual Activity   Alcohol use: Yes    Alcohol/week: 2.0 standard drinks of alcohol    Types: 2 Shots of liquor per week   Drug use: Never   Sexual activity: Not Currently  Other Topics Concern   Not on file  Social History Narrative   Not on file   Social Determinants of Health   Financial Resource Strain: Low Risk  (06/08/2022)   Overall Financial Resource Strain (CARDIA)    Difficulty of Paying Living Expenses: Not hard at all  Food Insecurity: No Food Insecurity (06/08/2022)   Hunger Vital Sign    Worried About Running Out of Food in the Last Year: Never true    Ran Out of Food in the Last Year: Never true  Transportation Needs: No Transportation Needs (06/08/2022)   PRAPARE - Hydrologist (Medical): No  Lack of Transportation (Non-Medical): No  Physical Activity: Sufficiently Active (06/08/2022)   Exercise Vital Sign    Days of Exercise per Week: 7 days    Minutes of Exercise per Session: 30 min  Stress: No Stress Concern Present (06/08/2022)   [employer] of Occupational Health - Occupational Stress Questionnaire    Feeling of Stress : Only a little  Social Connections: Not on file    Tobacco Counseling Ready to quit: Yes Counseling given: Not Answered Tobacco comments: 1ppdx20 years, plus 3/4ppdx 15    Clinical Intake:  Pre-visit preparation completed: Yes  Pain : 0-10 Pain Score: 6  Pain Type: Chronic pain Pain Location: Hand Pain Orientation: Left, Right Pain Descriptors / Indicators: Aching Pain Onset: More than a month ago Pain Frequency: Constant     Nutritional Status: BMI 25 -29 Overweight Nutritional  Risks: None Diabetes: No  How often do you need to have someone help you when you read instructions, pamphlets, or other written materials from your doctor or pharmacy?: 1 - Never  Diabetic? no  Interpreter Needed?: No  Information entered by :: NAllen LPN   Activities of Daily Living    06/08/2022    2:50 PM  In your present state of health, do you have any difficulty performing the following activities:  Hearing? 0  Vision? 0  Difficulty concentrating or making decisions? 0  Walking or climbing stairs? 0  Dressing or bathing? 0  Doing errands, shopping? 0  Preparing Food and eating ? N  Using the Toilet? N  In the past six months, have you accidently leaked urine? N  Do you have problems with loss of bowel control? N  Managing your Medications? N  Managing your Finances? N  Housekeeping or managing your Housekeeping? N    Patient Care Team: Dorothyann Peng, MD as PCP - General (Internal Medicine) Irene Limbo, DDS as Referring Physician (Dentistry) Clarene Duke, Karma Lew, RN as [facility] Care Management  Indicate any recent Medical Services you may have received from other than [facility] providers in the past year (date may be approximate).     Assessment:   This is a routine wellness examination for Samantha Gomez.  Hearing/Vision screen Vision Screening - Comments:: No regular eye exams,  Dietary issues and exercise activities discussed: Current Exercise Habits: Home exercise routine, Type of exercise: walking, Time (Minutes): 30, Frequency (Times/Week): 7, Weekly Exercise (Minutes/Week): 210   Goals Addressed             This Visit's Progress    Patient Stated       06/08/2022, wants to lose 5-10 pounds       Depression Screen    06/08/2022    2:49 PM 05/13/2021   10:06 AM 05/06/2021   10:00 AM 04/28/2020   12:06 PM 10/08/2019    8:41 AM 09/24/2018   11:10 AM 03/31/2018    9:32 AM  PHQ 2/9 Scores  PHQ - 2 Score 0 0 0 1 0 0 3  PHQ- 9 Score       3     Fall Risk    06/08/2022    2:49 PM 05/13/2021   10:06 AM 05/06/2021   10:00 AM 04/28/2020   11:18 AM 04/09/2019   11:06 AM  Fall Risk   Falls in the past year? 0 0 0 0 0  Number falls in past yr: 0  0    Injury with Fall? 0  0    Risk for fall due to :  Medication side effect      Follow up Falls prevention discussed;Education provided;Falls evaluation completed Falls evaluation completed;Education provided;Falls prevention discussed       FALL RISK PREVENTION PERTAINING TO THE HOME:  Any stairs in or around the home? Yes  If so, are there any without handrails? No  Home free of loose throw rugs in walkways, pet beds, electrical cords, etc? Yes  Adequate lighting in your home to reduce risk of falls? Yes   ASSISTIVE DEVICES UTILIZED TO PREVENT FALLS:  Life alert? No  Use of a cane, walker or w/c? No  Grab bars in the bathroom? Yes  Shower chair or bench in shower? No  Elevated toilet seat or a handicapped toilet? No   TIMED UP AND GO:  Was the test performed? Yes .  Length of time to ambulate 10 feet: 5 sec.   Gait steady and fast without use of assistive device  Cognitive Function:        06/08/2022    2:51 PM 05/13/2021   10:08 AM 04/28/2020   11:20 AM  6CIT Screen  What Year? 0 points 0 points 0 points  What month? 0 points 0 points 0 points  What time? 0 points 0 points 0 points  Count back from 20 0 points 0 points 0 points  Months in reverse 0 points 0 points 0 points  Repeat phrase 0 points 2 points 0 points  Total Score 0 points 2 points 0 points    Immunizations Immunization History  Administered Date(s) Administered   DTaP 09/22/2012   PFIZER Comirnaty(Gray Top)Covid-19 Tri-Sucrose Vaccine 05/14/2020   PFIZER(Purple Top)SARS-COV-2 Vaccination 08/22/2019, 09/19/2019   Zoster Recombinat (Shingrix) 11/16/2021, 03/21/2022    TDAP status: Up to date  Flu Vaccine status: Declined, Education has been provided regarding the importance of this vaccine  but patient still declined. Advised may receive this vaccine at local pharmacy or Health Dept. Aware to provide a copy of the vaccination record if obtained from local pharmacy or Health Dept. Verbalized acceptance and understanding.  Pneumococcal vaccine status: Declined,  Education has been provided regarding the importance of this vaccine but patient still declined. Advised may receive this vaccine at local pharmacy or Health Dept. Aware to provide a copy of the vaccination record if obtained from local pharmacy or Health Dept. Verbalized acceptance and understanding.   Covid-19 vaccine status: Completed vaccines  Qualifies for Shingles Vaccine? Yes   Zostavax completed Yes   Shingrix Completed?: Yes  Screening Tests Health Maintenance  Topic Date Due   COVID-19 Vaccine (4 - 2023-24 season) 01/21/2022   INFLUENZA VACCINE  08/21/2022 (Originally 12/21/2021)   Pneumonia Vaccine 78+ Years old (1 - PCV) 06/09/2023 (Originally 11/27/1961)   DTaP/Tdap/Td (2 - Tdap) 09/23/2022   Medicare Annual Wellness (AWV)  06/09/2023   MAMMOGRAM  01/18/2024   COLONOSCOPY (Pts 45-36yrs Insurance coverage will need to be confirmed)  06/07/2025   DEXA SCAN  Completed   Hepatitis C Screening  Completed   Zoster Vaccines- Shingrix  Completed   HPV VACCINES  Aged Out    Health Maintenance  Health Maintenance Due  Topic Date Due   COVID-19 Vaccine (4 - 2023-24 season) 01/21/2022    Colorectal cancer screening: Type of screening: Colonoscopy. Completed 06/08/2015. Repeat every 7 years  Mammogram status: Completed 01/17/2022. Repeat every year  Bone Density status: Completed 10/27/2016.   Lung Cancer Screening: (Low Dose CT Chest recommended if Age 30-80 years, 30 pack-year currently smoking OR have quit w/in 15years.)  does not qualify.   Lung Cancer Screening Referral: no  Additional Screening:  Hepatitis C Screening: does qualify; Completed 09/22/2012  Vision Screening: Recommended annual ophthalmology  exams for early detection of glaucoma and other disorders of the eye. Is the patient up to date with their annual eye exam?  No  Who is the provider or what is the name of the office in which the patient attends annual eye exams? none If pt is not established with a provider, would they like to be referred to a provider to establish care? No .   Dental Screening: Recommended annual dental exams for proper oral hygiene  Community Resource Referral / Chronic Care Management: CRR required this visit?  No   CCM required this visit?  No      Plan:     I have personally reviewed and noted the following in the patient's chart:   Medical and social history Use of alcohol, tobacco or illicit drugs  Current medications and supplements including opioid prescriptions. Patient is not currently taking opioid prescriptions. Functional ability and status Nutritional status Physical activity Advanced directives List of other physicians Hospitalizations, surgeries, and ER visits in previous 12 months Vitals Screenings to include cognitive, depression, and falls Referrals and appointments  In addition, I have reviewed and discussed with patient certain preventive protocols, quality metrics, and best practice recommendations. A written personalized care plan for preventive services as well as general preventive health recommendations were provided to patient.     Barb Merino, LPN   08/19/5186   Nurse Notes: none

## 2022-06-08 NOTE — Patient Instructions (Signed)
Ms. Samantha Gomez , Thank you for taking time to come for your Medicare Wellness Visit. I appreciate your ongoing commitment to your health goals. Please review the following plan we discussed and let me know if I can assist you in the future.   These are the goals we discussed:  Goals      Patient Stated     Learn how to play golf     Patient Stated     05/13/2021, wants to weigh 145-150 pounds     Patient Stated     06/08/2022, wants to lose 5-10 pounds     Quit Smoking        This is a list of the screening recommended for you and due dates:  Health Maintenance  Topic Date Due   COVID-19 Vaccine (4 - 2023-24 season) 01/21/2022   Flu Shot  08/21/2022*   Pneumonia Vaccine (1 - PCV) 06/09/2023*   DTaP/Tdap/Td vaccine (2 - Tdap) 09/23/2022   Medicare Annual Wellness Visit  06/09/2023   Mammogram  01/18/2024   Colon Cancer Screening  06/07/2025   DEXA scan (bone density measurement)  Completed   Hepatitis C Screening: USPSTF Recommendation to screen - Ages 68-79 yo.  Completed   Zoster (Shingles) Vaccine  Completed   HPV Vaccine  Aged Out  *Topic was postponed. The date shown is not the original due date.    Advanced directives: Advance directive discussed with you today. Even though you declined this today please call our office should you change your mind and we can give you the proper paperwork for you to fill out.  Conditions/risks identified: smoking  Next appointment: Follow up in one year for your annual wellness visit    Preventive Care 68 Years and Older, Female Preventive care refers to lifestyle choices and visits with your health care provider that can promote health and wellness. What does preventive care include? A yearly physical exam. This is also called an annual well check. Dental exams once or twice a year. Routine eye exams. Ask your health care provider how often you should have your eyes checked. Personal lifestyle choices, including: Daily care of your  teeth and gums. Regular physical activity. Eating a healthy diet. Avoiding tobacco and drug use. Limiting alcohol use. Practicing safe sex. Taking low-dose aspirin every day. Taking vitamin and mineral supplements as recommended by your health care provider. What happens during an annual well check? The services and screenings done by your health care provider during your annual well check will depend on your age, overall health, lifestyle risk factors, and family history of disease. Counseling  Your health care provider may ask you questions about your: Alcohol use. Tobacco use. Drug use. Emotional well-being. Home and relationship well-being. Sexual activity. Eating habits. History of falls. Memory and ability to understand (cognition). Work and work Statistician. Reproductive health. Screening  You may have the following tests or measurements: Height, weight, and BMI. Blood pressure. Lipid and cholesterol levels. These may be checked every 5 years, or more frequently if you are over 18 years old. Skin check. Lung cancer screening. You may have this screening every year starting at age 20 if you have a 30-pack-year history of smoking and currently smoke or have quit within the past 15 years. Fecal occult blood test (FOBT) of the stool. You may have this test every year starting at age 53. Flexible sigmoidoscopy or colonoscopy. You may have a sigmoidoscopy every 5 years or a colonoscopy every 10 years starting at age  50. Hepatitis C blood test. Hepatitis B blood test. Sexually transmitted disease (STD) testing. Diabetes screening. This is done by checking your blood sugar (glucose) after you have not eaten for a while (fasting). You may have this done every 1-3 years. Bone density scan. This is done to screen for osteoporosis. You may have this done starting at age 9. Mammogram. This may be done every 1-2 years. Talk to your health care provider about how often you should have  regular mammograms. Talk with your health care provider about your test results, treatment options, and if necessary, the need for more tests. Vaccines  Your health care provider may recommend certain vaccines, such as: Influenza vaccine. This is recommended every year. Tetanus, diphtheria, and acellular pertussis (Tdap, Td) vaccine. You may need a Td booster every 10 years. Zoster vaccine. You may need this after age 8. Pneumococcal 13-valent conjugate (PCV13) vaccine. One dose is recommended after age 30. Pneumococcal polysaccharide (PPSV23) vaccine. One dose is recommended after age 68. Talk to your health care provider about which screenings and vaccines you need and how often you need them. This information is not intended to replace advice given to you by your health care provider. Make sure you discuss any questions you have with your health care provider. Document Released: 06/05/2015 Document Revised: 01/27/2016 Document Reviewed: 03/10/2015 Elsevier Interactive Patient Education  2017 [place] Prevention in the Home Falls can cause injuries. They can happen to people of all ages. There are many things you can do to make your home safe and to help prevent falls. What can I do on the outside of my home? Regularly fix the edges of walkways and driveways and fix any cracks. Remove anything that might make you trip as you walk through a door, such as a raised step or threshold. Trim any bushes or trees on the path to your home. Use bright outdoor lighting. Clear any walking paths of anything that might make someone trip, such as rocks or tools. Regularly check to see if handrails are loose or broken. Make sure that both sides of any steps have handrails. Any raised decks and porches should have guardrails on the edges. Have any leaves, snow, or ice cleared regularly. Use sand or salt on walking paths during winter. Clean up any spills in your garage right away. This  includes oil or grease spills. What can I do in the bathroom? Use night lights. Install grab bars by the toilet and in the tub and shower. Do not use towel bars as grab bars. Use non-skid mats or decals in the tub or shower. If you need to sit down in the shower, use a plastic, non-slip stool. Keep the floor dry. Clean up any water that spills on the floor as soon as it happens. Remove soap buildup in the tub or shower regularly. Attach bath mats securely with double-sided non-slip rug tape. Do not have throw rugs and other things on the floor that can make you trip. What can I do in the bedroom? Use night lights. Make sure that you have a light by your bed that is easy to reach. Do not use any sheets or blankets that are too big for your bed. They should not hang down onto the floor. Have a firm chair that has side arms. You can use this for support while you get dressed. Do not have throw rugs and other things on the floor that can make you trip. What can I do  in the kitchen? Clean up any spills right away. Avoid walking on wet floors. Keep items that you use a lot in easy-to-reach places. If you need to reach something above you, use a strong step stool that has a grab bar. Keep electrical cords out of the way. Do not use floor polish or wax that makes floors slippery. If you must use wax, use non-skid floor wax. Do not have throw rugs and other things on the floor that can make you trip. What can I do with my stairs? Do not leave any items on the stairs. Make sure that there are handrails on both sides of the stairs and use them. Fix handrails that are broken or loose. Make sure that handrails are as long as the stairways. Check any carpeting to make sure that it is firmly attached to the stairs. Fix any carpet that is loose or worn. Avoid having throw rugs at the top or bottom of the stairs. If you do have throw rugs, attach them to the floor with carpet tape. Make sure that you  have a light switch at the top of the stairs and the bottom of the stairs. If you do not have them, ask someone to add them for you. What else can I do to help prevent falls? Wear shoes that: Do not have high heels. Have rubber bottoms. Are comfortable and fit you well. Are closed at the toe. Do not wear sandals. If you use a stepladder: Make sure that it is fully opened. Do not climb a closed stepladder. Make sure that both sides of the stepladder are locked into place. Ask someone to hold it for you, if possible. Clearly mark and make sure that you can see: Any grab bars or handrails. First and last steps. Where the edge of each step is. Use tools that help you move around (mobility aids) if they are needed. These include: Canes. Walkers. Scooters. Crutches. Turn on the lights when you go into a dark area. Replace any light bulbs as soon as they burn out. Set up your furniture so you have a clear path. Avoid moving your furniture around. If any of your floors are uneven, fix them. If there are any pets around you, be aware of where they are. Review your medicines with your doctor. Some medicines can make you feel dizzy. This can increase your chance of falling. Ask your doctor what other things that you can do to help prevent falls. This information is not intended to replace advice given to you by your health care provider. Make sure you discuss any questions you have with your health care provider. Document Released: 03/05/2009 Document Revised: 10/15/2015 Document Reviewed: 06/13/2014 Elsevier Interactive Patient Education  2017 [employer].

## 2022-06-15 ENCOUNTER — Other Ambulatory Visit: Payer: Self-pay | Admitting: Internal Medicine

## 2022-06-15 DIAGNOSIS — I10 Essential (primary) hypertension: Secondary | ICD-10-CM

## 2022-06-16 ENCOUNTER — Ambulatory Visit (INDEPENDENT_AMBULATORY_CARE_PROVIDER_SITE_OTHER): Payer: Medicare PPO | Admitting: Internal Medicine

## 2022-06-16 ENCOUNTER — Encounter: Payer: Self-pay | Admitting: Internal Medicine

## 2022-06-16 VITALS — BP 136/82 | HR 83 | Temp 98.1°F | Ht 64.0 in | Wt 160.0 lb

## 2022-06-16 DIAGNOSIS — R7309 Other abnormal glucose: Secondary | ICD-10-CM

## 2022-06-16 DIAGNOSIS — Z0001 Encounter for general adult medical examination with abnormal findings: Secondary | ICD-10-CM | POA: Diagnosis not present

## 2022-06-16 DIAGNOSIS — M79641 Pain in right hand: Secondary | ICD-10-CM

## 2022-06-16 DIAGNOSIS — M79642 Pain in left hand: Secondary | ICD-10-CM | POA: Diagnosis not present

## 2022-06-16 DIAGNOSIS — Z6827 Body mass index (BMI) 27.0-27.9, adult: Secondary | ICD-10-CM

## 2022-06-16 DIAGNOSIS — E2839 Other primary ovarian failure: Secondary | ICD-10-CM

## 2022-06-16 DIAGNOSIS — Z0000 Encounter for general adult medical examination without abnormal findings: Secondary | ICD-10-CM

## 2022-06-16 DIAGNOSIS — E7800 Pure hypercholesterolemia, unspecified: Secondary | ICD-10-CM | POA: Diagnosis not present

## 2022-06-16 DIAGNOSIS — I10 Essential (primary) hypertension: Secondary | ICD-10-CM

## 2022-06-16 LAB — POCT URINALYSIS DIPSTICK
Bilirubin, UA: NEGATIVE
Glucose, UA: NEGATIVE
Ketones, UA: NEGATIVE
Leukocytes, UA: NEGATIVE
Nitrite, UA: NEGATIVE
Protein, UA: NEGATIVE
Spec Grav, UA: 1.015 (ref 1.010–1.025)
Urobilinogen, UA: 0.2 E.U./dL
pH, UA: 6 (ref 5.0–8.0)

## 2022-06-16 NOTE — Patient Instructions (Signed)

## 2022-06-16 NOTE — Progress Notes (Signed)
I,Victoria T Hamilton,acting as a scribe for Maximino Greenland, MD.,have documented all relevant documentation on the behalf of Maximino Greenland, MD,as directed by  Maximino Greenland, MD while in the presence of Maximino Greenland, MD.   Subjective:     Patient ID: Samantha Gomez , female    DOB: 03/12/1956 , 67 y.o.   MRN: [id number]   Chief Complaint  Patient presents with  . Annual Exam  . Hypertension    HPI  She is here today for a full physical examination. She reports compliance with meds. Denies headaches, chest pain and shortness of breath. She reports she has incorporated more exercise into her daily routine. She has no specific concerns at this time.   Hypertension This is a chronic problem. The current episode started more than 1 year ago. The problem has been gradually improving since onset. The problem is controlled. Pertinent negatives include no blurred vision, chest pain, palpitations or shortness of breath. Risk factors for coronary artery disease include smoking/tobacco exposure, stress and post-menopausal state.     Past Medical History:  Diagnosis Date  . Allergy   . Benign hypertensive heart disease   . Chronic kidney disease   . Disorder of nasal cavity   . Hypertension      Family History  Problem Relation Age of Onset  . Dementia Mother   . Lung cancer Father      Current Outpatient Medications:  .  b complex vitamins tablet, Take 1 tablet by mouth daily., Disp: , Rfl:  .  Cholecalciferol (VITAMIN D-3) 5000 units TABS, Take by mouth daily., Disp: , Rfl:  .  niacin 250 MG tablet, Take 250 mg by mouth daily., Disp: , Rfl:  .  TRIBENZOR 40-5-25 MG TABS, TAKE 1 TABLET EVERY DAY, Disp: 90 tablet, Rfl: 3   No Known Allergies    The patient states she uses {contraceptive methods:5051} for birth control. Last LMP was No LMP recorded. Patient is postmenopausal.. {Dysmenorrhea-menorrhagia:21918}. Negative for: breast discharge, breast lump(s), breast pain and breast  self exam. Associated symptoms include abnormal vaginal bleeding. Pertinent negatives include abnormal bleeding (hematology), anxiety, decreased libido, depression, difficulty falling sleep, dyspareunia, history of infertility, nocturia, sexual dysfunction, sleep disturbances, urinary incontinence, urinary urgency, vaginal discharge and vaginal itching. Diet regular.The patient states her exercise level is    . The patient's tobacco use is:  Social History   Tobacco Use  Smoking Status Heavy Smoker  . Packs/day: 0.75  . Years: 20.00  . Total pack years: 15.00  . Types: Cigarettes  . Start date: 10/07/1969  . Passive exposure: Current  Smokeless Tobacco Never  Tobacco Comments   1ppdx20 years, plus 3/4ppdx 15   . She has been exposed to passive smoke. The patient's alcohol use is:  Social History   Substance and Sexual Activity  Alcohol Use Yes  . Alcohol/week: 2.0 standard drinks of alcohol  . Types: 2 Shots of liquor per week  . Additional information: Last pap ***, next one scheduled for ***.    Review of Systems  Constitutional: Negative.   HENT: Negative.    Eyes: Negative.  Negative for blurred vision.  Respiratory: Negative.  Negative for shortness of breath.   Cardiovascular: Negative.  Negative for chest pain and palpitations.  Gastrointestinal: Negative.   Endocrine: Negative.   Genitourinary: Negative.   Musculoskeletal:  Positive for arthralgias.       She c/o b/l hand pain. Denies fall/trauma. States her thumb aches. She is retired.  Does a lot of work in her garden. Described as an achy feeling. Denies stiffness upon awakening.   Skin: Negative.   Allergic/Immunologic: Negative.   Neurological: Negative.   Hematological: Negative.   Psychiatric/Behavioral: Negative.       Today's Vitals   06/16/22 1421  BP: 136/82  Pulse: 83  Temp: 98.1 F (36.7 C)  SpO2: 98%  Weight: 160 lb (72.6 kg)  Height: 5\' 4"  (1.626 m)   Body mass index is 27.46 kg/m.  Wt  Readings from Last 3 Encounters:  06/16/22 160 lb (72.6 kg)  06/08/22 161 lb 9.6 oz (73.3 kg)  05/03/22 160 lb 9.6 oz (72.8 kg)    Objective:  Physical Exam Vitals and nursing note reviewed.  Constitutional:      Appearance: Normal appearance.  HENT:     Head: Normocephalic and atraumatic.     Right Ear: Tympanic membrane, ear canal and external ear normal.     Left Ear: Tympanic membrane, ear canal and external ear normal.     Nose:     Comments: Masked     Mouth/Throat:     Comments: Masked  Eyes:     Extraocular Movements: Extraocular movements intact.     Conjunctiva/sclera: Conjunctivae normal.     Pupils: Pupils are equal, round, and reactive to light.  Cardiovascular:     Rate and Rhythm: Normal rate and regular rhythm.     Pulses: Normal pulses.     Heart sounds: Normal heart sounds.  Pulmonary:     Effort: Pulmonary effort is normal.     Breath sounds: Normal breath sounds.  Chest:  Breasts:    Tanner Score is 5.     Right: Normal.     Left: Normal.  Abdominal:     General: Abdomen is flat. Bowel sounds are normal.     Palpations: Abdomen is soft.  Genitourinary:    Comments: deferred Musculoskeletal:        General: Normal range of motion.     Cervical back: Normal range of motion and neck supple.  Skin:    General: Skin is warm and dry.  Neurological:     General: No focal deficit present.     Mental Status: She is alert and oriented to person, place, and time.  Psychiatric:        Mood and Affect: Mood normal.        Behavior: Behavior normal.        Assessment And Plan:     1. Encounter for physical examination Comments: A full exam was performed. Importance of monthly self breast exams was discussed with the patient.  2. Essential hypertension, benign Comments: Chronic, controlled. EKG performed, NSR w/ RAE. She will c/w Tribenzor 40/5/25mg  daily for now. If needed, I will add amlodipine qpm if BP stays elevated. - POCT Urinalysis Dipstick  (81002) - Microalbumin / creatinine urine ratio - EKG 12-Lead - CBC - Lipid panel - Liver Profile  3. Pain in both hands Comments: She c/o b/l hand pain, esp in thumb area. Possibly due to tendonitis. She agrees to Hand referral for furhter eval and radiographic studies. - Ambulatory referral to Hand Surgery  4. Pure hypercholesterolemia Comments: Chronic, she is not on statin therapy. Prefers to control with diet/exercise. Will eval ASCVD risk once results are available. - Lipid panel - Liver Profile  5. Hypoestrogenism Comments: I will refer her for bone density. Encouraged to engage in weight-bearing exercises at least 3 days per week. -  DG Bone Density; Future  6. Other abnormal glucose Comments: Her a1c has been elevated in the past. I will check this today. She is encouraged to limit her intake of sugary beverages/foods.  7. Body mass index (BMI) 27.0-27.9, adult Comments: She is encouraged to aim for at least 150 minutes of exercise per week.  Patient was given opportunity to ask questions. Patient verbalized understanding of the plan and was able to repeat key elements of the plan. All questions were answered to their satisfaction.   I, Gwynneth Aliment, MD, have reviewed all documentation for this visit. The documentation on 06/16/22 for the exam, diagnosis, procedures, and orders are all accurate and complete.   THE PATIENT IS ENCOURAGED TO PRACTICE SOCIAL DISTANCING DUE TO THE COVID-19 PANDEMIC.

## 2022-06-17 LAB — LIPID PANEL
Chol/HDL Ratio: 2.3 ratio (ref 0.0–4.4)
Cholesterol, Total: 228 mg/dL — ABNORMAL HIGH (ref 100–199)
HDL: 99 mg/dL (ref >39)
LDL Chol Calc (NIH): 119 mg/dL — ABNORMAL HIGH (ref 0–99)
Triglycerides: 59 mg/dL (ref 0–149)
VLDL Cholesterol Cal: 10 mg/dL (ref 5–40)

## 2022-06-17 LAB — CBC
Hematocrit: 41.4 % (ref 34.0–46.6)
Hemoglobin: 14.4 g/dL (ref 11.1–15.9)
MCH: 29.3 pg (ref 26.6–33.0)
MCHC: 34.8 g/dL (ref 31.5–35.7)
MCV: 84 fL (ref 79–97)
Platelets: 235 10*3/uL (ref 150–450)
RBC: 4.92 x10E6/uL (ref 3.77–5.28)
RDW: 12.5 % (ref 11.7–15.4)
WBC: 4.5 10*3/uL (ref 3.4–10.8)

## 2022-06-17 LAB — HEPATIC FUNCTION PANEL
ALT: 17 IU/L (ref 0–32)
AST: 22 IU/L (ref 0–40)
Albumin: 4.4 g/dL (ref 3.9–4.9)
Alkaline Phosphatase: 76 IU/L (ref 44–121)
Bilirubin Total: 0.4 mg/dL (ref 0.0–1.2)
Bilirubin, Direct: 0.13 mg/dL (ref 0.00–0.40)
Total Protein: 6.5 g/dL (ref 6.0–8.5)

## 2022-06-18 LAB — MICROALBUMIN / CREATININE URINE RATIO
Creatinine, Urine: 42.9 mg/dL
Microalb/Creat Ratio: <7 mg/g creat (ref 0–29)
Microalbumin, Urine: <3 ug/mL

## 2022-06-21 ENCOUNTER — Other Ambulatory Visit: Payer: Medicare PPO

## 2022-06-21 DIAGNOSIS — M79642 Pain in left hand: Secondary | ICD-10-CM | POA: Diagnosis not present

## 2022-06-21 DIAGNOSIS — M79641 Pain in right hand: Secondary | ICD-10-CM | POA: Diagnosis not present

## 2022-06-23 LAB — SEDIMENTATION RATE: Sed Rate: 14 mm/hr (ref 0–40)

## 2022-06-23 LAB — URIC ACID: Uric Acid: 5.6 mg/dL (ref 3.0–7.2)

## 2022-06-23 LAB — ANTINUCLEAR ANTIBODIES, IFA: ANA Titer 1: NEGATIVE

## 2022-06-23 LAB — CYCLIC CITRUL PEPTIDE ANTIBODY, IGG/IGA: Cyclic Citrullin Peptide Ab: 83 units — ABNORMAL HIGH (ref 0–19)

## 2022-06-23 LAB — RHEUMATOID FACTOR: Rheumatoid fact SerPl-aCnc: <10 IU/mL (ref <14.0)

## 2022-06-27 ENCOUNTER — Other Ambulatory Visit: Payer: Self-pay

## 2022-06-27 DIAGNOSIS — E7800 Pure hypercholesterolemia, unspecified: Secondary | ICD-10-CM

## 2022-06-27 MED ORDER — ATORVASTATIN CALCIUM 20 MG PO TABS: 20.0000 mg | ORAL_TABLET | Freq: Every day | ORAL | 11 refills | Status: DC

## 2022-06-30 DIAGNOSIS — M1811 Unilateral primary osteoarthritis of first carpometacarpal joint, right hand: Secondary | ICD-10-CM | POA: Diagnosis not present

## 2022-06-30 DIAGNOSIS — M180 Bilateral primary osteoarthritis of first carpometacarpal joints: Secondary | ICD-10-CM | POA: Diagnosis not present

## 2022-06-30 DIAGNOSIS — M1812 Unilateral primary osteoarthritis of first carpometacarpal joint, left hand: Secondary | ICD-10-CM | POA: Diagnosis not present

## 2022-07-27 DIAGNOSIS — K648 Other hemorrhoids: Secondary | ICD-10-CM | POA: Diagnosis not present

## 2022-07-27 DIAGNOSIS — D125 Benign neoplasm of sigmoid colon: Secondary | ICD-10-CM | POA: Diagnosis not present

## 2022-07-27 DIAGNOSIS — Z86010 Personal history of colonic polyps: Secondary | ICD-10-CM | POA: Diagnosis not present

## 2022-07-27 DIAGNOSIS — K635 Polyp of colon: Secondary | ICD-10-CM | POA: Diagnosis not present

## 2022-07-27 DIAGNOSIS — K5730 Diverticulosis of large intestine without perforation or abscess without bleeding: Secondary | ICD-10-CM | POA: Diagnosis not present

## 2022-07-27 DIAGNOSIS — Z1211 Encounter for screening for malignant neoplasm of colon: Secondary | ICD-10-CM | POA: Diagnosis not present

## 2022-08-03 ENCOUNTER — Encounter (HOSPITAL_COMMUNITY): Payer: TRICARE For Life (TFL)

## 2022-08-03 ENCOUNTER — Ambulatory Visit: Payer: TRICARE For Life (TFL) | Admitting: Vascular Surgery

## 2022-08-24 ENCOUNTER — Ambulatory Visit: Payer: Medicare PPO | Admitting: Internal Medicine

## 2022-08-24 ENCOUNTER — Encounter: Payer: Self-pay | Admitting: Internal Medicine

## 2022-08-24 VITALS — BP 130/74 | HR 93 | Temp 98.7°F | Ht 64.0 in | Wt 156.6 lb

## 2022-08-24 DIAGNOSIS — F17210 Nicotine dependence, cigarettes, uncomplicated: Secondary | ICD-10-CM | POA: Diagnosis not present

## 2022-08-24 DIAGNOSIS — E7800 Pure hypercholesterolemia, unspecified: Secondary | ICD-10-CM | POA: Diagnosis not present

## 2022-08-24 DIAGNOSIS — I10 Essential (primary) hypertension: Secondary | ICD-10-CM

## 2022-08-24 DIAGNOSIS — Z638 Other specified problems related to primary support group: Secondary | ICD-10-CM | POA: Diagnosis not present

## 2022-08-24 DIAGNOSIS — F17200 Nicotine dependence, unspecified, uncomplicated: Secondary | ICD-10-CM

## 2022-08-24 NOTE — Progress Notes (Signed)
Barnet Glasgow Martin,acting as a Education administrator for Maximino Greenland, MD.,have documented all relevant documentation on the behalf of Maximino Greenland, MD,as directed by  Maximino Greenland, MD while in the presence of Maximino Greenland, MD.    Subjective:     Patient ID: Samantha Gomez , female    DOB: Nov 10, 1955 , 67 y.o.   MRN: [id number]   Chief Complaint  Patient presents with   Hyperlipidemia   Hypertension    HPI  She is here today for cholesterol check. Patient reports compliance with her meds. She denies headaches, chest pain and shortness of breath.  She is happy to report she is now exercising regularly.   BP Readings from Last 3 Encounters: 08/24/22 : 130/74 06/16/22 : 136/82 06/08/22 : 124/62    Hyperlipidemia This is a chronic problem. The current episode started more than 1 year ago. The problem is controlled. She has no history of chronic renal disease, liver disease or nephrotic syndrome.  Hypertension This is a chronic problem. The current episode started more than 1 year ago. The problem is controlled. Pertinent negatives include no blurred vision. Risk factors for coronary artery disease include smoking/tobacco exposure, stress and post-menopausal state. The current treatment provides moderate improvement. Compliance problems include exercise.  There is no history of chronic renal disease.     Past Medical History:  Diagnosis Date   Allergy    Benign hypertensive heart disease    Chronic kidney disease    Disorder of nasal cavity    Hypertension      Family History  Problem Relation Age of Onset   Dementia Mother    Lung cancer Father      Current Outpatient Medications:    atorvastatin (LIPITOR) 20 MG tablet, Take 1 tablet (20 mg total) by mouth daily., Disp: 30 tablet, Rfl: 11   b complex vitamins tablet, Take 1 tablet by mouth daily., Disp: , Rfl:    Cholecalciferol (VITAMIN D-3) 5000 units TABS, Take by mouth daily., Disp: , Rfl:    niacin 250 MG tablet, Take  250 mg by mouth daily., Disp: , Rfl:    TRIBENZOR 40-5-25 MG TABS, TAKE 1 TABLET EVERY DAY, Disp: 90 tablet, Rfl: 3   No Known Allergies   Review of Systems  Constitutional: Negative.   Eyes:  Negative for blurred vision.  Respiratory: Negative.    Cardiovascular: Negative.   Gastrointestinal: Negative.   Musculoskeletal: Negative.   Skin: Negative.   Neurological: Negative.   Psychiatric/Behavioral:  Positive for dysphoric mood.        She admits she has been feeling sad. States she there is some tension between her and her youngest son. She has property that he wants her to GIVE to him. She is hesitant to do at this time, but she does plan to will the land to him. He is upset because he wants to build a home on the property NOW. She states he has not been visiting as much since this discussion.      Today's Vitals   08/24/22 1420  BP: 130/74  Pulse: 93  Temp: 98.7 F (37.1 C)  TempSrc: Oral  Weight: 156 lb 9.6 oz (71 kg)  Height: 5\' 4"  (1.626 m)  PainSc: 0-No pain   Body mass index is 26.88 kg/m.  Wt Readings from Last 3 Encounters:  08/24/22 156 lb 9.6 oz (71 kg)  06/16/22 160 lb (72.6 kg)  06/08/22 161 lb 9.6 oz (73.3 kg)  Objective:  Physical Exam Vitals and nursing note reviewed.  Constitutional:      Appearance: Normal appearance.  HENT:     Head: Normocephalic and atraumatic.     Nose:     Comments: Masked     Mouth/Throat:     Comments: Masked  Eyes:     Extraocular Movements: Extraocular movements intact.  Cardiovascular:     Rate and Rhythm: Normal rate and regular rhythm.     Heart sounds: Normal heart sounds.  Pulmonary:     Effort: Pulmonary effort is normal.     Breath sounds: Normal breath sounds.  Musculoskeletal:     Cervical back: Normal range of motion.  Skin:    General: Skin is warm.  Neurological:     General: No focal deficit present.     Mental Status: She is alert.  Psychiatric:        Mood and Affect: Mood normal.         Behavior: Behavior normal.         Assessment And Plan:     1. Pure hypercholesterolemia Comments: Chronic, now on atorvastatin. I will check labs as below. Importance of addressing all cardiac risk factors was stressed to the patient. F/u 3 months. - Lipid panel - ALT  2. Essential hypertension, benign Comments: Chronic, fair control. No med changes today. - BMP8+eGFR  3. Tobacco use disorder Comments: Chronic, not yet ready to quit. She is encouraged to decrease number of cigs smoked/day.  4. Stress due to family tension Comments: She declines therapy at this time. She will consider in the future if needed.   Patient was given opportunity to ask questions. Patient verbalized understanding of the plan and was able to repeat key elements of the plan. All questions were answered to their satisfaction.   I, Maximino Greenland, MD, have reviewed all documentation for this visit. The documentation on 08/24/22 for the exam, diagnosis, procedures, and orders are all accurate and complete.   IF YOU HAVE BEEN REFERRED TO A SPECIALIST, IT MAY TAKE 1-2 WEEKS TO SCHEDULE/PROCESS THE REFERRAL. IF YOU HAVE NOT HEARD FROM US/SPECIALIST IN TWO WEEKS, PLEASE GIVE [place] A CALL AT [phone].   THE PATIENT IS ENCOURAGED TO PRACTICE SOCIAL DISTANCING DUE TO THE COVID-19 PANDEMIC.

## 2022-08-24 NOTE — Patient Instructions (Signed)

## 2022-08-25 LAB — LIPID PANEL
Chol/HDL Ratio: 2.1 ratio (ref 0.0–4.4)
Cholesterol, Total: 222 mg/dL — ABNORMAL HIGH (ref 100–199)
HDL: 107 mg/dL (ref >39)
LDL Chol Calc (NIH): 105 mg/dL — ABNORMAL HIGH (ref 0–99)
Triglycerides: 56 mg/dL (ref 0–149)
VLDL Cholesterol Cal: 10 mg/dL (ref 5–40)

## 2022-08-25 LAB — BMP8+EGFR
BUN/Creatinine Ratio: 12 (ref 12–28)
BUN: 11 mg/dL (ref 8–27)
CO2: 24 mmol/L (ref 20–29)
Calcium: 9.4 mg/dL (ref 8.7–10.3)
Chloride: 102 mmol/L (ref 96–106)
Creatinine, Ser: 0.9 mg/dL (ref 0.57–1.00)
Glucose: 82 mg/dL (ref 70–99)
Potassium: 3.9 mmol/L (ref 3.5–5.2)
Sodium: 142 mmol/L (ref 134–144)
eGFR: 70 mL/min/{1.73_m2} (ref >59)

## 2022-08-25 LAB — ALT: ALT: 19 IU/L (ref 0–32)

## 2022-09-01 ENCOUNTER — Other Ambulatory Visit: Payer: Self-pay

## 2022-09-01 ENCOUNTER — Other Ambulatory Visit: Payer: Self-pay | Admitting: Internal Medicine

## 2022-09-01 MED ORDER — ATORVASTATIN CALCIUM 40 MG PO TABS: ORAL_TABLET | ORAL | 1 refills | Status: DC

## 2022-09-19 ENCOUNTER — Other Ambulatory Visit: Payer: TRICARE For Life (TFL)

## 2022-12-02 ENCOUNTER — Encounter: Payer: Self-pay | Admitting: Internal Medicine

## 2022-12-02 ENCOUNTER — Ambulatory Visit (INDEPENDENT_AMBULATORY_CARE_PROVIDER_SITE_OTHER): Payer: Medicare PPO | Admitting: Internal Medicine

## 2022-12-02 VITALS — BP 118/78 | HR 78 | Temp 97.7°F | Ht 64.0 in | Wt 155.6 lb

## 2022-12-02 DIAGNOSIS — I10 Essential (primary) hypertension: Secondary | ICD-10-CM

## 2022-12-02 DIAGNOSIS — M791 Myalgia, unspecified site: Secondary | ICD-10-CM

## 2022-12-02 DIAGNOSIS — M25511 Pain in right shoulder: Secondary | ICD-10-CM

## 2022-12-02 DIAGNOSIS — M25512 Pain in left shoulder: Secondary | ICD-10-CM

## 2022-12-02 DIAGNOSIS — E663 Overweight: Secondary | ICD-10-CM | POA: Diagnosis not present

## 2022-12-02 DIAGNOSIS — Z6826 Body mass index (BMI) 26.0-26.9, adult: Secondary | ICD-10-CM

## 2022-12-02 DIAGNOSIS — F17200 Nicotine dependence, unspecified, uncomplicated: Secondary | ICD-10-CM

## 2022-12-02 DIAGNOSIS — E7800 Pure hypercholesterolemia, unspecified: Secondary | ICD-10-CM

## 2022-12-02 NOTE — Patient Instructions (Addendum)
[employer]  Hypertension, Adult Hypertension is another name for high blood pressure. High blood pressure forces your heart to work harder to pump blood. This can cause problems over time. There are two numbers in a blood pressure reading. There is a top number (systolic) over a bottom number (diastolic). It is best to have a blood pressure that is below 120/80. What are the causes? The cause of this condition is not known. Some other conditions can lead to high blood pressure. What increases the risk? Some lifestyle factors can make you more likely to develop high blood pressure: Smoking. Not getting enough exercise or physical activity. Being overweight. Having too much fat, sugar, calories, or salt (sodium) in your diet. Drinking too much alcohol. Other risk factors include: Having any of these conditions: Heart disease. Diabetes. High cholesterol. Kidney disease. Obstructive sleep apnea. Having a family history of high blood pressure and high cholesterol. Age. The risk increases with age. Stress. What are the signs or symptoms? High blood pressure may not cause symptoms. Very high blood pressure (hypertensive crisis) may cause: Headache. Fast or uneven heartbeats (palpitations). Shortness of breath. Nosebleed. Vomiting or feeling like you may vomit (nauseous). Changes in how you see. Very bad chest pain. Feeling dizzy. Seizures. How is this treated? This condition is treated by making healthy lifestyle changes, such as: Eating healthy foods. Exercising more. Drinking less alcohol. Your doctor may prescribe medicine if lifestyle changes do not help enough and if: Your top number is above 130. Your bottom number is above 80. Your personal target blood pressure may vary. Follow these instructions at home: Eating and drinking  If told, follow the DASH eating plan. To follow this plan: Fill one half of your plate at each meal with fruits and vegetables. Fill  one fourth of your plate at each meal with whole grains. Whole grains include whole-wheat pasta, brown rice, and whole-grain bread. Eat or drink low-fat dairy products, such as skim milk or low-fat yogurt. Fill one fourth of your plate at each meal with low-fat (lean) proteins. Low-fat proteins include fish, chicken without skin, eggs, beans, and tofu. Avoid fatty meat, cured and processed meat, or chicken with skin. Avoid pre-made or processed food. Limit the amount of salt in your diet to less than 1,500 mg each day. Do not drink alcohol if: Your doctor tells you not to drink. You are pregnant, may be pregnant, or are planning to become pregnant. If you drink alcohol: Limit how much you have to: 0-1 drink a day for women. 0-2 drinks a day for men. Know how much alcohol is in your drink. In the [place] one drink equals one 12 oz bottle of beer (355 mL), one 5 oz glass of wine (148 mL), or one 1 oz glass of hard liquor (44 mL). Lifestyle  Work with your doctor to stay at a healthy weight or to lose weight. Ask your doctor what the best weight is for you. Get at least 30 minutes of exercise that causes your heart to beat faster (aerobic exercise) most days of the week. This may include walking, swimming, or biking. Get at least 30 minutes of exercise that strengthens your muscles (resistance exercise) at least 3 days a week. This may include lifting weights or doing Pilates. Do not smoke or use any products that contain nicotine or tobacco. If you need help quitting, ask your doctor. Check your blood pressure at home as told by your doctor. Keep all follow-up visits. Medicines  Take over-the-counter and prescription medicines only as told by your doctor. Follow directions carefully. Do not skip doses of blood pressure medicine. The medicine does not work as well if you skip doses. Skipping doses also puts you at risk for problems. Ask your doctor about side effects or reactions to medicines  that you should watch for. Contact a doctor if: You think you are having a reaction to the medicine you are taking. You have headaches that keep coming back. You feel dizzy. You have swelling in your ankles. You have trouble with your vision. Get help right away if: You get a very bad headache. You start to feel mixed up (confused). You feel weak or numb. You feel faint. You have very bad pain in your: Chest. Belly (abdomen). You vomit more than once. You have trouble breathing. These symptoms may be an emergency. Get help right away. Call 911. Do not wait to see if the symptoms will go away. Do not drive yourself to the hospital. Summary Hypertension is another name for high blood pressure. High blood pressure forces your heart to work harder to pump blood. For most people, a normal blood pressure is less than 120/80. Making healthy choices can help lower blood pressure. If your blood pressure does not get lower with healthy choices, you may need to take medicine. This information is not intended to replace advice given to you by your health care provider. Make sure you discuss any questions you have with your health care provider. Document Revised: 02/25/2021 Document Reviewed: 02/25/2021 Elsevier Patient Education  2024 [employer].

## 2022-12-02 NOTE — Progress Notes (Signed)
I,Victoria T Deloria Lair, CMA,acting as a Neurosurgeon for Gwynneth Aliment, MD.,have documented all relevant documentation on the behalf of Gwynneth Aliment, MD,as directed by  Gwynneth Aliment, MD while in the presence of Gwynneth Aliment, MD.  Subjective:  Patient ID: Samantha Gomez , female    DOB: August 17, 1955 , 67 y.o.   MRN: [id number]  Chief Complaint  Patient presents with   Hypertension   Hyperlipidemia    HPI  She is here today for HTN & cholesterol check. Patient reports compliance with her meds. She denies headaches, chest pain and shortness of breath.    She reports having a reaction to atorvastatin. She admits having soreness in her shoulders after starting the medication. She reports this does interrupt how she sleeps at night. She has since stopped the medication. She admits her sx have persisted despite stopping the medication.   Hypertension This is a chronic problem. The current episode started more than 1 year ago. The problem is controlled. Pertinent negatives include no blurred vision. Risk factors for coronary artery disease include smoking/tobacco exposure, stress and post-menopausal state. The current treatment provides moderate improvement. Compliance problems include exercise.      Past Medical History:  Diagnosis Date   Allergy    Benign hypertensive heart disease    Chronic kidney disease    Disorder of nasal cavity    Hypertension      Family History  Problem Relation Age of Onset   Dementia Mother    Lung cancer Father      Current Outpatient Medications:    b complex vitamins tablet, Take 1 tablet by mouth daily., Disp: , Rfl:    Cholecalciferol (VITAMIN D-3) 5000 units TABS, Take by mouth daily., Disp: , Rfl:    niacin 250 MG tablet, Take 250 mg by mouth daily., Disp: , Rfl:    TRIBENZOR 40-5-25 MG TABS, TAKE 1 TABLET EVERY DAY, Disp: 90 tablet, Rfl: 3   atorvastatin (LIPITOR) 40 MG tablet, TAKE 1 TABLET DAILY. MONDAY-SATURDAY SKIP SUNDAYS (Patient not taking:  Reported on 12/02/2022), Disp: 90 tablet, Rfl: 0   No Known Allergies   Review of Systems  Constitutional: Negative.   Eyes:  Negative for blurred vision.  Respiratory: Negative.    Cardiovascular: Negative.   Gastrointestinal: Negative.   Musculoskeletal:  Positive for myalgias.  Skin: Negative.   Neurological: Negative.   Psychiatric/Behavioral: Negative.       Today's Vitals   12/02/22 1438  BP: 118/78  Pulse: 78  Temp: 97.7 F (36.5 C)  SpO2: 98%  Weight: 155 lb 9.6 oz (70.6 kg)  Height: 5\' 4"  (1.626 m)   Body mass index is 26.71 kg/m.  Wt Readings from Last 3 Encounters:  12/02/22 155 lb 9.6 oz (70.6 kg)  08/24/22 156 lb 9.6 oz (71 kg)  06/16/22 160 lb (72.6 kg)     Objective:  Physical Exam Vitals and nursing note reviewed.  Constitutional:      Appearance: Normal appearance.  HENT:     Head: Normocephalic and atraumatic.  Eyes:     Extraocular Movements: Extraocular movements intact.  Cardiovascular:     Rate and Rhythm: Normal rate and regular rhythm.     Heart sounds: Normal heart sounds.  Pulmonary:     Effort: Pulmonary effort is normal.     Breath sounds: Normal breath sounds.  Musculoskeletal:        General: Tenderness present. Normal range of motion.     Cervical back: Normal range  of motion.  Skin:    General: Skin is warm.  Neurological:     General: No focal deficit present.     Mental Status: She is alert.  Psychiatric:        Mood and Affect: Mood normal.        Behavior: Behavior normal.         Assessment And Plan:  Essential hypertension, benign Assessment & Plan: Chronic, controlled. She will c/2 Tribenzor 40/5/25mg  daily. She is encouraged to follow low sodium diet. She will rto in four to six months for re-evaluation.    Pure hypercholesterolemia Assessment & Plan: She has stopped atorvastatin due to shoulder pain. Importance of medication/dietary compliance was d/w patient. She is encouraged to follow heart healthy  diet, Mediterranean diet is a great example.   Orders: -     CMP14+EGFR -     Lipid panel  Bilateral shoulder pain, unspecified chronicity Assessment & Plan: Again, encouraged to stay well hydrated and to perform stretching exercises. Pt advised that since her sx have persisted, sx not likely CAUSED by statin therapy. However, it is possible that statin therapy exacerbated her symptoms.    Myalgia Assessment & Plan: I will check Cpk today. She is encouraged to stay well hydrated. She may also benefit from CoQ10mg  daily while on statin therapy.   Orders: -     CK  Tobacco use disorder Assessment & Plan: Unfortunately, she repeatedly declines LDCT.    Overweight with body mass index (BMI) of 26 to 26.9 in adult Assessment & Plan: Her BMI is acceptable for her demographic. She is encouraged to aim for at least 150 minutes of exercise/week.       Return if symptoms worsen or fail to improve.  Patient was given opportunity to ask questions. Patient verbalized understanding of the plan and was able to repeat key elements of the plan. All questions were answered to their satisfaction.   I, Gwynneth Aliment, MD, have reviewed all documentation for this visit. The documentation on 12/02/22 for the exam, diagnosis, procedures, and orders are all accurate and complete.   IF YOU HAVE BEEN REFERRED TO A SPECIALIST, IT MAY TAKE 1-2 WEEKS TO SCHEDULE/PROCESS THE REFERRAL. IF YOU HAVE NOT HEARD FROM US/SPECIALIST IN TWO WEEKS, PLEASE GIVE [place] A CALL AT [phone].   THE PATIENT IS ENCOURAGED TO PRACTICE SOCIAL DISTANCING DUE TO THE COVID-19 PANDEMIC.

## 2022-12-03 LAB — LIPID PANEL
Chol/HDL Ratio: 2.1 ratio (ref 0.0–4.4)
Cholesterol, Total: 214 mg/dL — ABNORMAL HIGH (ref 100–199)
HDL: 100 mg/dL
LDL Chol Calc (NIH): 106 mg/dL — ABNORMAL HIGH (ref 0–99)
Triglycerides: 45 mg/dL (ref 0–149)
VLDL Cholesterol Cal: 8 mg/dL (ref 5–40)

## 2022-12-03 LAB — CMP14+EGFR
ALT: 20 IU/L (ref 0–32)
AST: 21 IU/L (ref 0–40)
Albumin: 4.4 g/dL (ref 3.9–4.9)
Alkaline Phosphatase: 79 IU/L (ref 44–121)
BUN/Creatinine Ratio: 16 (ref 12–28)
BUN: 15 mg/dL (ref 8–27)
Bilirubin Total: 0.4 mg/dL (ref 0.0–1.2)
CO2: 26 mmol/L (ref 20–29)
Calcium: 9.5 mg/dL (ref 8.7–10.3)
Chloride: 102 mmol/L (ref 96–106)
Creatinine, Ser: 0.93 mg/dL (ref 0.57–1.00)
Globulin, Total: 2 g/dL (ref 1.5–4.5)
Glucose: 88 mg/dL (ref 70–99)
Potassium: 4.1 mmol/L (ref 3.5–5.2)
Sodium: 141 mmol/L (ref 134–144)
Total Protein: 6.4 g/dL (ref 6.0–8.5)
eGFR: 67 mL/min/{1.73_m2} (ref >59)

## 2022-12-03 LAB — CK: Total CK: 151 U/L (ref 32–182)

## 2022-12-15 ENCOUNTER — Ambulatory Visit: Payer: Medicare PPO | Admitting: Internal Medicine

## 2022-12-15 DIAGNOSIS — M25511 Pain in right shoulder: Secondary | ICD-10-CM | POA: Insufficient documentation

## 2022-12-15 DIAGNOSIS — M791 Myalgia, unspecified site: Secondary | ICD-10-CM | POA: Insufficient documentation

## 2022-12-15 DIAGNOSIS — E7800 Pure hypercholesterolemia, unspecified: Secondary | ICD-10-CM | POA: Insufficient documentation

## 2022-12-15 NOTE — Assessment & Plan Note (Signed)
She has stopped atorvastatin due to shoulder pain. Importance of medication/dietary compliance was d/w patient. She is encouraged to follow heart healthy diet, Mediterranean diet is a great example.

## 2022-12-15 NOTE — Assessment & Plan Note (Signed)
Again, encouraged to stay well hydrated and to perform stretching exercises. Pt advised that since her sx have persisted, sx not likely CAUSED by statin therapy. However, it is possible that statin therapy exacerbated her symptoms.

## 2022-12-15 NOTE — Assessment & Plan Note (Signed)
Unfortunately, she repeatedly declines LDCT.

## 2022-12-15 NOTE — Assessment & Plan Note (Signed)
Her BMI is acceptable for her demographic. She is encouraged to aim for at least 150 minutes of exercise/week.

## 2022-12-15 NOTE — Assessment & Plan Note (Signed)
Chronic, controlled. She will c/2 Tribenzor 40/5/25mg  daily. She is encouraged to follow low sodium diet. She will rto in four to six months for re-evaluation.

## 2022-12-15 NOTE — Assessment & Plan Note (Signed)
I will check Cpk today. She is encouraged to stay well hydrated. She may also benefit from CoQ10mg  daily while on statin therapy.

## 2023-01-26 ENCOUNTER — Other Ambulatory Visit: Payer: Self-pay | Admitting: Internal Medicine

## 2023-01-26 DIAGNOSIS — Z6827 Body mass index (BMI) 27.0-27.9, adult: Secondary | ICD-10-CM

## 2023-01-26 DIAGNOSIS — E2839 Other primary ovarian failure: Secondary | ICD-10-CM

## 2023-01-26 DIAGNOSIS — Z0000 Encounter for general adult medical examination without abnormal findings: Secondary | ICD-10-CM

## 2023-01-26 DIAGNOSIS — I10 Essential (primary) hypertension: Secondary | ICD-10-CM

## 2023-01-26 DIAGNOSIS — E7800 Pure hypercholesterolemia, unspecified: Secondary | ICD-10-CM

## 2023-01-26 DIAGNOSIS — M79641 Pain in right hand: Secondary | ICD-10-CM

## 2023-01-26 DIAGNOSIS — R7309 Other abnormal glucose: Secondary | ICD-10-CM

## 2023-02-01 ENCOUNTER — Inpatient Hospital Stay: Admission: RE | Admit: 2023-02-01 | Payer: TRICARE For Life (TFL) | Source: Ambulatory Visit

## 2023-06-01 ENCOUNTER — Other Ambulatory Visit: Payer: Self-pay | Admitting: Internal Medicine

## 2023-06-01 DIAGNOSIS — I10 Essential (primary) hypertension: Secondary | ICD-10-CM

## 2023-06-13 DIAGNOSIS — Z1231 Encounter for screening mammogram for malignant neoplasm of breast: Secondary | ICD-10-CM | POA: Diagnosis not present

## 2023-06-13 LAB — HM MAMMOGRAPHY

## 2023-06-27 ENCOUNTER — Ambulatory Visit (INDEPENDENT_AMBULATORY_CARE_PROVIDER_SITE_OTHER): Payer: Medicare PPO | Admitting: Internal Medicine

## 2023-06-27 ENCOUNTER — Encounter: Payer: Self-pay | Admitting: Internal Medicine

## 2023-06-27 VITALS — BP 110/78 | Temp 98.3°F | Ht 64.0 in | Wt 155.0 lb

## 2023-06-27 DIAGNOSIS — R946 Abnormal results of thyroid function studies: Secondary | ICD-10-CM | POA: Diagnosis not present

## 2023-06-27 DIAGNOSIS — F17200 Nicotine dependence, unspecified, uncomplicated: Secondary | ICD-10-CM | POA: Diagnosis not present

## 2023-06-27 DIAGNOSIS — Z0000 Encounter for general adult medical examination without abnormal findings: Secondary | ICD-10-CM | POA: Insufficient documentation

## 2023-06-27 DIAGNOSIS — R0989 Other specified symptoms and signs involving the circulatory and respiratory systems: Secondary | ICD-10-CM

## 2023-06-27 DIAGNOSIS — E7800 Pure hypercholesterolemia, unspecified: Secondary | ICD-10-CM

## 2023-06-27 DIAGNOSIS — R7989 Other specified abnormal findings of blood chemistry: Secondary | ICD-10-CM | POA: Diagnosis not present

## 2023-06-27 DIAGNOSIS — L819 Disorder of pigmentation, unspecified: Secondary | ICD-10-CM

## 2023-06-27 DIAGNOSIS — I10 Essential (primary) hypertension: Secondary | ICD-10-CM | POA: Diagnosis not present

## 2023-06-27 DIAGNOSIS — E785 Hyperlipidemia, unspecified: Secondary | ICD-10-CM | POA: Diagnosis not present

## 2023-06-27 HISTORY — DX: Encounter for general adult medical examination without abnormal findings: Z00.00

## 2023-06-27 LAB — POCT URINALYSIS DIPSTICK
Bilirubin, UA: NEGATIVE
Blood, UA: NEGATIVE
Glucose, UA: NEGATIVE
Leukocytes, UA: NEGATIVE
Nitrite, UA: NEGATIVE
Protein, UA: NEGATIVE
Spec Grav, UA: 1.02 (ref 1.010–1.025)
Urobilinogen, UA: 1 U/dL
pH, UA: 7 (ref 5.0–8.0)

## 2023-06-27 NOTE — Patient Instructions (Signed)

## 2023-06-27 NOTE — Assessment & Plan Note (Addendum)
 Chronic, she has stopped atorvastatin  due to shoulder pain. Importance of medication/dietary compliance was d/w patient. She is encouraged to follow heart healthy diet, Mediterranean diet is a great example. I will check labs today and consider starting a different statin. She declines cardiac calcium  score at this time.

## 2023-06-27 NOTE — Assessment & Plan Note (Signed)
Unfortunately, she repeatedly declines LDCT.

## 2023-06-27 NOTE — Assessment & Plan Note (Signed)
 Chronic, controlled. EKG performed, NSR w/o acute changes.  She will c/w Tribenzor  40/5/25mg  daily. She is encouraged to follow low sodium diet. She will rto in four to six months for re-evaluation. She was congratulated on her lifestyle changes and encouraged to keep up the great work.

## 2023-06-27 NOTE — Assessment & Plan Note (Signed)

## 2023-06-27 NOTE — Progress Notes (Signed)
I,Victoria T Deloria Lair, CMA,acting as a Neurosurgeon for Gwynneth Aliment, MD.,have documented all relevant documentation on the behalf of Gwynneth Aliment, MD,as directed by  Gwynneth Aliment, MD while in the presence of Gwynneth Aliment, MD.  Subjective:    Patient ID: Samantha Gomez , female    DOB: 1955/06/11 , 68 y.o.   MRN: [id number]  Chief Complaint  Patient presents with   Annual Exam   Hypertension   Hyperlipidemia    HPI  She is here today for a full physical examination. She reports compliance with meds. Denies having any headaches, chest pain and shortness of breath. She is happy to report she has incorporated more exercise into her daily routine. She has no specific concerns at this time.   Hypertension This is a chronic problem. The current episode started more than 1 year ago. The problem has been gradually improving since onset. The problem is controlled. Pertinent negatives include no blurred vision, chest pain, palpitations or shortness of breath. Risk factors for coronary artery disease include smoking/tobacco exposure, stress and post-menopausal state.     Past Medical History:  Diagnosis Date   Allergy    Benign hypertensive heart disease    Chronic kidney disease    Disorder of nasal cavity    Encounter for annual health examination 06/27/2023   Hypertension      Family History  Problem Relation Age of Onset   Dementia Mother    Lung cancer Father      Current Outpatient Medications:    b complex vitamins tablet, Take 1 tablet by mouth daily., Disp: , Rfl:    Cholecalciferol (VITAMIN D-3) 5000 units TABS, Take by mouth daily., Disp: , Rfl:    niacin 250 MG tablet, Take 250 mg by mouth daily., Disp: , Rfl:    TRIBENZOR 40-5-25 MG TABS, TAKE 1 TABLET EVERY DAY, Disp: 90 tablet, Rfl: 3   atorvastatin (LIPITOR) 40 MG tablet, TAKE 1 TABLET DAILY. MONDAY-SATURDAY SKIP SUNDAYS (Patient not taking: Reported on 06/27/2023), Disp: 90 tablet, Rfl: 0   No Known Allergies     The patient states she uses post menopausal status for birth control. No LMP recorded. Patient is postmenopausal.. Negative for Dysmenorrhea. Negative for: breast discharge, breast lump(s), breast pain and breast self exam. Associated symptoms include abnormal vaginal bleeding. Pertinent negatives include abnormal bleeding (hematology), anxiety, decreased libido, depression, difficulty falling sleep, dyspareunia, history of infertility, nocturia, sexual dysfunction, sleep disturbances, urinary incontinence, urinary urgency, vaginal discharge and vaginal itching. Diet regular.The patient states her exercise level is    . The patient's tobacco use is:  Social History   Tobacco Use  Smoking Status Heavy Smoker   Current packs/day: 0.75   Average packs/day: 0.8 packs/day for 53.7 years (40.3 ttl pk-yrs)   Types: Cigarettes   Start date: 10/07/1969   Passive exposure: Current  Smokeless Tobacco Never  Tobacco Comments   1ppdx20 years, plus 3/4ppdx 15   . She has been exposed to passive smoke. The patient's alcohol use is:  Social History   Substance and Sexual Activity  Alcohol Use Yes   Alcohol/week: 2.0 standard drinks of alcohol   Types: 2 Shots of liquor per week   Review of Systems  Constitutional: Negative.   HENT: Negative.    Eyes: Negative.  Negative for blurred vision.  Respiratory: Negative.  Negative for shortness of breath.   Cardiovascular: Negative.  Negative for chest pain and palpitations.  Gastrointestinal: Negative.   Endocrine: Negative.  Genitourinary: Negative.   Musculoskeletal: Negative.   Skin: Negative.        Bump on ankle  Allergic/Immunologic: Negative.   Neurological: Negative.   Hematological: Negative.   Psychiatric/Behavioral: Negative.       Today's Vitals   06/27/23 1132  BP: 110/78  Temp: 98.3 F (36.8 C)  SpO2: 98%  Weight: 155 lb (70.3 kg)  Height: 5\' 4"  (1.626 m)   Body mass index is 26.61 kg/m.  Wt Readings from Last 3  Encounters:  06/27/23 155 lb (70.3 kg)  12/02/22 155 lb 9.6 oz (70.6 kg)  08/24/22 156 lb 9.6 oz (71 kg)     Objective:  Physical Exam Vitals and nursing note reviewed.  Constitutional:      Appearance: Normal appearance.  HENT:     Head: Normocephalic and atraumatic.     Right Ear: Tympanic membrane, ear canal and external ear normal.     Left Ear: Tympanic membrane, ear canal and external ear normal.  Eyes:     Extraocular Movements: Extraocular movements intact.     Conjunctiva/sclera: Conjunctivae normal.     Pupils: Pupils are equal, round, and reactive to light.  Cardiovascular:     Rate and Rhythm: Normal rate and regular rhythm.     Pulses:          Dorsalis pedis pulses are 1+ on the right side and 1+ on the left side.     Heart sounds: Normal heart sounds.  Pulmonary:     Effort: Pulmonary effort is normal.     Breath sounds: Normal breath sounds.  Chest:  Breasts:    Tanner Score is 5.     Right: Normal.     Left: Normal.  Abdominal:     General: Abdomen is flat. Bowel sounds are normal.     Palpations: Abdomen is soft.  Genitourinary:    Comments: deferred Musculoskeletal:        General: Normal range of motion.     Cervical back: Normal range of motion and neck supple.     Comments: Pos squeeze test b/l hands  Skin:    General: Skin is warm and dry.     Comments: Scab below left lateral malleolus, no overlying erythema Areas of facial hyperpigmentation  Neurological:     General: No focal deficit present.     Mental Status: She is alert and oriented to person, place, and time.  Psychiatric:        Mood and Affect: Mood normal.        Behavior: Behavior normal.         Assessment And Plan:     Encounter for annual health examination Assessment & Plan: A full exam was performed.  Importance of monthly self breast exams was discussed with the patient.  She is advised to get 30-45 minutes of regular exercise, no less than four to five days per week.  Both weight-bearing and aerobic exercises are recommended.  She is advised to follow a healthy diet with at least six fruits/veggies per day, decrease intake of red meat and other saturated fats and to increase fish intake to twice weekly.  Meats/fish should not be fried -- baked, boiled or broiled is preferable. It is also important to cut back on your sugar intake.  Be sure to read labels - try to avoid anything with added sugar, high fructose corn syrup or other sweeteners.  If you must use a sweetener, you can try stevia or monkfruit.  It is also important to avoid artificially sweetened foods/beverages and diet drinks. Lastly, wear SPF 50 sunscreen on exposed skin and when in direct sunlight for an extended period of time.  Be sure to avoid fast food restaurants and aim for at least 60 ounces of water daily.       Essential hypertension, benign Assessment & Plan: Chronic, controlled. EKG performed, NSR w/o acute changes.  She will c/w Tribenzor 40/5/25mg  daily. She is encouraged to follow low sodium diet. She will rto in four to six months for re-evaluation. She was congratulated on her lifestyle changes and encouraged to keep up the great work.   Orders: -     POCT urinalysis dipstick -     Microalbumin / creatinine urine ratio -     EKG 12-Lead -     CBC -     CMP14+EGFR -     Lipid panel  Pure hypercholesterolemia Assessment & Plan: Chronic, she has stopped atorvastatin due to shoulder pain. Importance of medication/dietary compliance was d/w patient. She is encouraged to follow heart healthy diet, Mediterranean diet is a great example. I will check labs today and consider starting a different statin. She declines cardiac calcium score at this time.   Orders: -     CBC -     CMP14+EGFR -     Lipid panel -     TSH -     Lipoprotein A (LPA)  Hyperpigmentation of skin of cheek -     Ambulatory referral to Dermatology  Decreased dorsalis pedis pulse -     VAS [place] ABI WITH/WO TBI;  Future  Tobacco use disorder Assessment & Plan: Unfortunately, she repeatedly declines LDCT.   Orders: -     VAS US [facility] MEDICARE SCREEN; Future     Return for 1 year physical, 6 month bp-----cancel 2/12 visit w/ RS, make AWV phone. Patient was given opportunity to ask questions. Patient verbalized understanding of the plan and was able to repeat key elements of the plan. All questions were answered to their satisfaction.   I, Gwynneth Aliment, MD, have reviewed all documentation for this visit. The documentation on 06/27/23 for the exam, diagnosis, procedures, and orders are all accurate and complete.

## 2023-06-28 LAB — CMP14+EGFR
ALT: 15 [IU]/L (ref 0–32)
AST: 19 [IU]/L (ref 0–40)
Albumin: 4 g/dL (ref 3.9–4.9)
Alkaline Phosphatase: 75 [IU]/L (ref 44–121)
BUN/Creatinine Ratio: 18 (ref 12–28)
BUN: 17 mg/dL (ref 8–27)
Bilirubin Total: 0.4 mg/dL (ref 0.0–1.2)
CO2: 24 mmol/L (ref 20–29)
Calcium: 9.6 mg/dL (ref 8.7–10.3)
Chloride: 101 mmol/L (ref 96–106)
Creatinine, Ser: 0.95 mg/dL (ref 0.57–1.00)
Globulin, Total: 2.2 g/dL (ref 1.5–4.5)
Glucose: 93 mg/dL (ref 70–99)
Potassium: 4 mmol/L (ref 3.5–5.2)
Sodium: 141 mmol/L (ref 134–144)
Total Protein: 6.2 g/dL (ref 6.0–8.5)
eGFR: 65 mL/min/{1.73_m2} (ref >59)

## 2023-06-28 LAB — CBC
Hematocrit: 44.9 % (ref 34.0–46.6)
Hemoglobin: 14.8 g/dL (ref 11.1–15.9)
MCH: 29.2 pg (ref 26.6–33.0)
MCHC: 33 g/dL (ref 31.5–35.7)
MCV: 89 fL (ref 79–97)
Platelets: 230 10*3/uL (ref 150–450)
RBC: 5.07 x10E6/uL (ref 3.77–5.28)
RDW: 12.8 % (ref 11.7–15.4)
WBC: 4 10*3/uL (ref 3.4–10.8)

## 2023-06-28 LAB — LIPID PANEL
Chol/HDL Ratio: 2.4 {ratio} (ref 0.0–4.4)
Cholesterol, Total: 210 mg/dL — ABNORMAL HIGH (ref 100–199)
HDL: 88 mg/dL (ref >39)
LDL Chol Calc (NIH): 108 mg/dL — ABNORMAL HIGH (ref 0–99)
Triglycerides: 80 mg/dL (ref 0–149)
VLDL Cholesterol Cal: 14 mg/dL (ref 5–40)

## 2023-06-28 LAB — MICROALBUMIN / CREATININE URINE RATIO
Creatinine, Urine: 133.9 mg/dL
Microalb/Creat Ratio: 14 mg/g{creat} (ref 0–29)
Microalbumin, Urine: 19.2 ug/mL

## 2023-06-28 LAB — TSH: TSH: 0.508 u[IU]/mL (ref 0.450–4.500)

## 2023-06-28 LAB — LIPOPROTEIN A (LPA): Lipoprotein (a): 115.3 nmol/L — ABNORMAL HIGH (ref <75.0)

## 2023-06-30 LAB — T4, FREE: Free T4: 1.29 ng/dL (ref 0.82–1.77)

## 2023-06-30 LAB — SPECIMEN STATUS REPORT

## 2023-07-05 ENCOUNTER — Ambulatory Visit: Payer: Medicare PPO

## 2023-07-05 ENCOUNTER — Ambulatory Visit: Payer: Self-pay | Admitting: Internal Medicine

## 2023-07-05 DIAGNOSIS — Z0000 Encounter for general adult medical examination without abnormal findings: Secondary | ICD-10-CM | POA: Diagnosis not present

## 2023-07-05 NOTE — Progress Notes (Signed)
Subjective:   Samantha Gomez is a 68 y.o. female who presents for Medicare Annual (Subsequent) preventive examination.  Visit Complete: Virtual I connected with  Samantha Gomez on 07/05/23 by a audio enabled telemedicine application and verified that I am speaking with the correct person using two identifiers. Interactive audio and video telecommunications were attempted between this provider and patient, however failed, due to patient having technical difficulties OR patient did not have access to video capability.  We continued and completed visit with audio only.  Patient Location: Home  Provider Location: Office/Clinic  I discussed the limitations of evaluation and management by telemedicine. The patient expressed understanding and agreed to proceed.  Vital Signs: Because this visit was a virtual/telehealth visit, some criteria may be missing or patient reported. Any vitals not documented were not able to be obtained and vitals that have been documented are patient reported.    Cardiac Risk Factors include: advanced age (>62men, >37 women);hypertension     Objective:    Today's Vitals   07/05/23 1427  PainSc: 3    There is no height or weight on file to calculate BMI.     07/05/2023    2:32 PM 06/08/2022    2:47 PM 05/13/2021   10:03 AM 04/28/2020   12:05 PM 04/28/2020   11:15 AM  Advanced Directives  Does Patient Have a Medical Advance Directive? No No No No No  Would patient like information on creating a medical advance directive?  No - Patient declined  Yes (MAU/Ambulatory/Procedural Areas - Information given) Yes (ED - Information included in AVS)    Current Medications (verified) Outpatient Encounter Medications as of 07/05/2023  Medication Sig   aspirin EC 81 MG tablet Take 81 mg by mouth daily. Swallow whole.   b complex vitamins tablet Take 1 tablet by mouth daily.   Cholecalciferol (VITAMIN D-3) 5000 units TABS Take by mouth daily.   niacin 250 MG tablet Take  250 mg by mouth daily.   TRIBENZOR 40-5-25 MG TABS TAKE 1 TABLET EVERY DAY   atorvastatin (LIPITOR) 40 MG tablet TAKE 1 TABLET DAILY. MONDAY-SATURDAY SKIP SUNDAYS (Patient not taking: Reported on 12/02/2022)   No facility-administered encounter medications on file as of 07/05/2023.    Allergies (verified) Patient has no known allergies.   History: Past Medical History:  Diagnosis Date   Allergy    Benign hypertensive heart disease    Chronic kidney disease    Disorder of nasal cavity    Encounter for annual health examination 06/27/2023   Hypertension    History reviewed. No pertinent surgical history. Family History  Problem Relation Age of Onset   Dementia Mother    Lung cancer Father    Social History   Socioeconomic History   Marital status: Single    Spouse name: Not on file   Number of children: Not on file   Years of education: Not on file   Highest education level: Not on file  Occupational History   Not on file  Tobacco Use   Smoking status: Heavy Smoker    Current packs/day: 0.75    Average packs/day: 0.8 packs/day for 53.7 years (40.3 ttl pk-yrs)    Types: Cigarettes    Start date: 10/07/1969    Passive exposure: Current   Smokeless tobacco: Never   Tobacco comments:    1ppdx20 years, plus 3/4ppdx 15   Vaping Use   Vaping status: Never Used  Substance and Sexual Activity   Alcohol use: Not Currently  Alcohol/week: 3.0 standard drinks of alcohol    Types: 3 Glasses of wine per week   Drug use: Never   Sexual activity: Not Currently  Other Topics Concern   Not on file  Social History Narrative   Not on file   Social Drivers of Health   Financial Resource Strain: Low Risk  (07/05/2023)   Overall Financial Resource Strain (CARDIA)    Difficulty of Paying Living Expenses: Not hard at all  Food Insecurity: No Food Insecurity (07/05/2023)   Hunger Vital Sign    Worried About Running Out of Food in the Last Year: Never true    Ran Out of Food in the  Last Year: Never true  Transportation Needs: No Transportation Needs (07/05/2023)   PRAPARE - Administrator, Civil Service (Medical): No    Lack of Transportation (Non-Medical): No  Physical Activity: Sufficiently Active (07/05/2023)   Exercise Vital Sign    Days of Exercise per Week: 7 days    Minutes of Exercise per Session: 30 min  Stress: No Stress Concern Present (07/05/2023)   [employer] of Occupational Health - Occupational Stress Questionnaire    Feeling of Stress : Not at all  Social Connections: Socially Isolated (07/05/2023)   Social Connection and Isolation Panel [NHANES]    Frequency of Communication with Friends and Family: Twice a week    Frequency of Social Gatherings with Friends and Family: Twice a week    Attends Religious Services: Never    Database administrator or Organizations: No    Attends Engineer, structural: Never    Marital Status: Never married    Tobacco Counseling Ready to quit: Yes Counseling given: Not Answered Tobacco comments: 1ppdx20 years, plus 3/4ppdx 15    Clinical Intake:  Pre-visit preparation completed: Yes  Pain : 0-10 Pain Score: 3  Pain Type: Chronic pain Pain Location: Hand Pain Orientation: Left, Right Pain Descriptors / Indicators: Aching Pain Onset: More than a month ago Pain Frequency: Constant     Nutritional Risks: None Diabetes: No  How often do you need to have someone help you when you read instructions, pamphlets, or other written materials from your doctor or pharmacy?: 1 - Never  Interpreter Needed?: No  Information entered by :: NAllen LPN   Activities of Daily Living    07/05/2023    2:28 PM  In your present state of health, do you have any difficulty performing the following activities:  Hearing? 0  Vision? 0  Difficulty concentrating or making decisions? 0  Walking or climbing stairs? 0  Dressing or bathing? 0  Doing errands, shopping? 0  Preparing Food and eating ? N   Using the Toilet? N  In the past six months, have you accidently leaked urine? N  Do you have problems with loss of bowel control? N  Managing your Medications? N  Managing your Finances? N  Housekeeping or managing your Housekeeping? N    Patient Care Team: Dorothyann Peng, MD as PCP - General (Internal Medicine) Irene Limbo, DDS as Referring Physician (Dentistry) Clarene Duke, Karma Lew, RN as [facility] Care Management  Indicate any recent Medical Services you may have received from other than [facility] providers in the past year (date may be approximate).     Assessment:   This is a routine wellness examination for Jennice.  Hearing/Vision screen Hearing Screening - Comments:: Denies hearing issues Vision Screening - Comments:: No regular eye exams   Goals Addressed  This Visit's Progress    Patient Stated       07/05/2023, quit smoking       Depression Screen    07/05/2023    2:34 PM 06/27/2023   11:33 AM 06/16/2022    2:25 PM 06/08/2022    2:49 PM 05/13/2021   10:06 AM 05/06/2021   10:00 AM 04/28/2020   12:06 PM  PHQ 2/9 Scores  PHQ - 2 Score 0 0 0 0 0 0 1  PHQ- 9 Score 1 1         Fall Risk    07/05/2023    2:33 PM 06/27/2023   11:32 AM 06/16/2022    2:25 PM 06/08/2022    2:49 PM 05/13/2021   10:06 AM  Fall Risk   Falls in the past year? 0 0 0 0 0  Number falls in past yr: 0 0 0 0   Injury with Fall? 0 0 0 0   Risk for fall due to : Medication side effect No Fall Risks No Fall Risks Medication side effect   Follow up Falls prevention discussed;Falls evaluation completed Falls evaluation completed Falls evaluation completed Falls prevention discussed;Education provided;Falls evaluation completed Falls evaluation completed;Education provided;Falls prevention discussed    MEDICARE RISK AT HOME: Medicare Risk at Home Any stairs in or around the home?: Yes If so, are there any without handrails?: Yes Home free of loose throw rugs in  walkways, pet beds, electrical cords, etc?: Yes Adequate lighting in your home to reduce risk of falls?: Yes Life alert?: No Use of a cane, walker or w/c?: No Grab bars in the bathroom?: No Shower chair or bench in shower?: No Elevated toilet seat or a handicapped toilet?: Yes  TIMED UP AND GO:  Was the test performed?  No    Cognitive Function:        07/05/2023    2:35 PM 06/08/2022    2:51 PM 05/13/2021   10:08 AM 04/28/2020   11:20 AM  6CIT Screen  What Year? 0 points 0 points 0 points 0 points  What month? 0 points 0 points 0 points 0 points  What time? 0 points 0 points 0 points 0 points  Count back from 20 0 points 0 points 0 points 0 points  Months in reverse 2 points 0 points 0 points 0 points  Repeat phrase 0 points 0 points 2 points 0 points  Total Score 2 points 0 points 2 points 0 points    Immunizations Immunization History  Administered Date(s) Administered   DTaP 09/22/2012   PFIZER Comirnaty(Gray Top)Covid-19 Tri-Sucrose Vaccine 05/14/2020   PFIZER(Purple Top)SARS-COV-2 Vaccination 08/22/2019, 09/19/2019   Zoster Recombinant(Shingrix) 11/16/2021, 03/21/2022    TDAP status: Due, Education has been provided regarding the importance of this vaccine. Advised may receive this vaccine at local pharmacy or Health Dept. Aware to provide a copy of the vaccination record if obtained from local pharmacy or Health Dept. Verbalized acceptance and understanding.  Flu Vaccine status: Declined, Education has been provided regarding the importance of this vaccine but patient still declined. Advised may receive this vaccine at local pharmacy or Health Dept. Aware to provide a copy of the vaccination record if obtained from local pharmacy or Health Dept. Verbalized acceptance and understanding.  Pneumococcal vaccine status: Declined,  Education has been provided regarding the importance of this vaccine but patient still declined. Advised may receive this vaccine at local  pharmacy or Health Dept. Aware to provide a copy of the vaccination record if  obtained from local pharmacy or Health Dept. Verbalized acceptance and understanding.   Covid-19 vaccine status: Declined, Education has been provided regarding the importance of this vaccine but patient still declined. Advised may receive this vaccine at local pharmacy or Health Dept.or vaccine clinic. Aware to provide a copy of the vaccination record if obtained from local pharmacy or Health Dept. Verbalized acceptance and understanding.  Qualifies for Shingles Vaccine? Yes   Zostavax completed No   Shingrix Completed?: Yes  Screening Tests Health Maintenance  Topic Date Due   Lung Cancer Screening  Never done   COVID-19 Vaccine (4 - 2024-25 season) 07/13/2023 (Originally 01/22/2023)   INFLUENZA VACCINE  08/21/2023 (Originally 12/22/2022)   Pneumonia Vaccine 3+ Years old (1 of 2 - PCV) 06/26/2024 (Originally 11/27/1961)   Medicare Annual Wellness (AWV)  07/04/2024   MAMMOGRAM  06/12/2025   Colonoscopy  07/26/2032   DEXA SCAN  Completed   Hepatitis C Screening  Completed   Zoster Vaccines- Shingrix  Completed   HPV VACCINES  Aged Out   DTaP/Tdap/Td  Discontinued    Health Maintenance  Health Maintenance Due  Topic Date Due   Lung Cancer Screening  Never done    Colorectal cancer screening: Type of screening: Colonoscopy. Completed 07/27/2022. Repeat every 7 years  Mammogram status: Completed 06/13/2023. Repeat every year  Bone Density status: Completed 10/27/2016.   Lung Cancer Screening: (Low Dose CT Chest recommended if Age 75-80 years, 20 pack-year currently smoking OR have quit w/in 15years.) does qualify.   Lung Cancer Screening Referral: discussed with Dr. Allyne Gee  Additional Screening:  Hepatitis C Screening: does qualify; Completed 09/22/2012  Vision Screening: Recommended annual ophthalmology exams for early detection of glaucoma and other disorders of the eye. Is the patient up to date with  their annual eye exam?  No  Who is the provider or what is the name of the office in which the patient attends annual eye exams? none If pt is not established with a provider, would they like to be referred to a provider to establish care? No .   Dental Screening: Recommended annual dental exams for proper oral hygiene  Diabetic Foot Exam: n/a  Community Resource Referral / Chronic Care Management: CRR required this visit?  No   CCM required this visit?  No     Plan:     I have personally reviewed and noted the following in the patient's chart:   Medical and social history Use of alcohol, tobacco or illicit drugs  Current medications and supplements including opioid prescriptions. Patient is not currently taking opioid prescriptions. Functional ability and status Nutritional status Physical activity Advanced directives List of other physicians Hospitalizations, surgeries, and ER visits in previous 12 months Vitals Screenings to include cognitive, depression, and falls Referrals and appointments  In addition, I have reviewed and discussed with patient certain preventive protocols, quality metrics, and best practice recommendations. A written personalized care plan for preventive services as well as general preventive health recommendations were provided to patient.     Barb Merino, LPN   06/01/6043   After Visit Summary: (Pick Up) Due to this being a telephonic visit, with patients personalized plan was offered to patient and patient has requested to Pick up at office.  Nurse Notes: none

## 2023-07-05 NOTE — Patient Instructions (Signed)
Ms. Dominy , Thank you for taking time to come for your Medicare Wellness Visit. I appreciate your ongoing commitment to your health goals. Please review the following plan we discussed and let me know if I can assist you in the future.   Referrals/Orders/Follow-Ups/Clinician Recommendations: none  This is a list of the screening recommended for you and due dates:  Health Maintenance  Topic Date Due   Screening for Lung Cancer  Never done   COVID-19 Vaccine (4 - 2024-25 season) 07/13/2023*   Flu Shot  08/21/2023*   Pneumonia Vaccine (1 of 2 - PCV) 06/26/2024*   Medicare Annual Wellness Visit  07/04/2024   Mammogram  06/12/2025   Colon Cancer Screening  07/26/2032   DEXA scan (bone density measurement)  Completed   Hepatitis C Screening  Completed   Zoster (Shingles) Vaccine  Completed   HPV Vaccine  Aged Out   DTaP/Tdap/Td vaccine  Discontinued  *Topic was postponed. The date shown is not the original due date.    Advanced directives: (ACP Link)Information on Advanced Care Planning can be found at [facility] Directives Advance Health Care Directives ([email or web address]   Next Medicare Annual Wellness Visit scheduled for next year: No, office will schedule  insert Preventive Care attachment Insert FALL PREVENTION attachment if needed

## 2023-07-11 ENCOUNTER — Ambulatory Visit (HOSPITAL_COMMUNITY)
Admission: RE | Admit: 2023-07-11 | Discharge: 2023-07-11 | Disposition: A | Discharge status: Home / Self Care | Payer: Medicare PPO | Source: Ambulatory Visit | Attending: Internal Medicine | Admitting: Internal Medicine

## 2023-07-11 ENCOUNTER — Ambulatory Visit (INDEPENDENT_AMBULATORY_CARE_PROVIDER_SITE_OTHER)
Admission: RE | Admit: 2023-07-11 | Discharge: 2023-07-11 | Disposition: A | Discharge status: Home / Self Care | Payer: Medicare PPO | Source: Ambulatory Visit | Attending: Internal Medicine | Admitting: Internal Medicine

## 2023-07-11 DIAGNOSIS — Z136 Encounter for screening for cardiovascular disorders: Secondary | ICD-10-CM | POA: Diagnosis not present

## 2023-07-11 DIAGNOSIS — F17200 Nicotine dependence, unspecified, uncomplicated: Secondary | ICD-10-CM | POA: Insufficient documentation

## 2023-07-11 DIAGNOSIS — R140 Abdominal distension (gaseous): Secondary | ICD-10-CM | POA: Diagnosis not present

## 2023-07-11 DIAGNOSIS — R0989 Other specified symptoms and signs involving the circulatory and respiratory systems: Secondary | ICD-10-CM

## 2023-07-11 DIAGNOSIS — I10 Essential (primary) hypertension: Secondary | ICD-10-CM | POA: Insufficient documentation

## 2023-07-11 LAB — VAS US ABI WITH/WO TBI
Left ABI: 0.71
Right ABI: 0.74

## 2023-07-13 ENCOUNTER — Other Ambulatory Visit: Payer: Self-pay | Admitting: Internal Medicine

## 2023-07-13 DIAGNOSIS — E7800 Pure hypercholesterolemia, unspecified: Secondary | ICD-10-CM

## 2023-07-13 MED ORDER — ROSUVASTATIN CALCIUM 10 MG PO TABS: ORAL_TABLET | ORAL | 0 refills | Status: DC

## 2023-08-21 ENCOUNTER — Ambulatory Visit: Payer: Self-pay

## 2023-08-21 NOTE — Telephone Encounter (Signed)
 Summary: Headache, Watery eyes, scratchy throat   Copied From CRM [id number]. Reason for Triage: The patient called stating she has been dealing with bad allergies. She said it has gotten worse in the last few years and she is dealing with a headache, watery eyes, and a scratchy throat. Please assist patient further by calling her        Wants recommendations for allergy pill she can take that will not work against anything else she is taking.  Office to call patient back.

## 2023-09-27 ENCOUNTER — Ambulatory Visit: Payer: Self-pay | Admitting: Internal Medicine

## 2023-09-27 ENCOUNTER — Encounter: Payer: Self-pay | Admitting: Internal Medicine

## 2023-09-27 VITALS — BP 130/80 | HR 72 | Temp 98.2°F | Ht 64.0 in | Wt 169.6 lb

## 2023-09-27 DIAGNOSIS — J301 Allergic rhinitis due to pollen: Secondary | ICD-10-CM | POA: Diagnosis not present

## 2023-09-27 DIAGNOSIS — E7800 Pure hypercholesterolemia, unspecified: Secondary | ICD-10-CM | POA: Diagnosis not present

## 2023-09-27 DIAGNOSIS — I10 Essential (primary) hypertension: Secondary | ICD-10-CM

## 2023-09-27 DIAGNOSIS — R635 Abnormal weight gain: Secondary | ICD-10-CM

## 2023-09-27 DIAGNOSIS — Z87891 Personal history of nicotine dependence: Secondary | ICD-10-CM | POA: Diagnosis not present

## 2023-09-27 MED ORDER — CETIRIZINE HCL 10 MG PO TABS: 10.0000 mg | ORAL_TABLET | Freq: Every day | ORAL | 2 refills | Status: AC

## 2023-09-27 NOTE — Patient Instructions (Addendum)
Coronary Calcium Scan A coronary calcium scan is an imaging test used to look for deposits of plaque in the inner lining of the blood vessels of the heart (coronary arteries). Plaque is made up of calcium, protein, and fatty substances. These deposits of plaque can partly clog and narrow the coronary arteries without producing any symptoms or warning signs. This puts a person at risk for a heart attack. A coronary calcium scan is performed using a computed tomography (CT) scanner machine without using a dye (contrast). This test is recommended for people who are at moderate risk for heart disease. The test can find plaque deposits before symptoms develop. Tell a health care provider about: Any allergies you have. All medicines you are taking, including vitamins, herbs, eye drops, creams, and over-the-counter medicines. Any problems you or family members have had with anesthetic medicines. Any bleeding problems you have. Any surgeries you have had. Any medical conditions you have. Whether you are pregnant or may be pregnant. What are the risks? Generally, this is a safe procedure. However, problems may occur, including: Harm to a pregnant woman and her unborn baby. This test involves the use of radiation. Radiation exposure can be dangerous to a pregnant woman and her unborn baby. If you are pregnant or think you may be pregnant, you should not have this procedure done. A slight increase in the risk of cancer. This is because of the radiation involved in the test. The amount of radiation from one test is similar to the amount of radiation you are naturally exposed to over one year. What happens before the procedure? Ask your health care provider for any specific instructions on how to prepare for this procedure. You may be asked to avoid products that contain caffeine, tobacco, or nicotine for 4 hours before the procedure. What happens during the procedure?  You will undress and remove any jewelry  from your neck or chest. You may need to remove hearing aides and dentures. Women may need to remove their bras. You will put on a hospital gown. Sticky electrodes will be placed on your chest. The electrodes will be connected to an electrocardiogram (ECG) machine to record a tracing of the electrical activity of your heart. You will lie down on your back on a curved bed that is attached to the CT scanner. You may be given medicine to slow down your heart rate so that clear pictures can be created. You will be moved into the CT scanner, and the CT scanner will take pictures of your heart. During this time, you will be asked to lie still and hold your breath for 10-20 seconds at a time while each picture of your heart is being taken. The procedure may vary among health care providers and hospitals. What can I expect after the procedure? You can return to your normal activities. It is up to you to get the results of your procedure. Ask your health care provider, or the department that is doing the procedure, when your results will be ready. Summary A coronary calcium scan is an imaging test used to look for deposits of plaque in the inner lining of the blood vessels of the heart. Plaque is made up of calcium, protein, and fatty substances. A coronary calcium scan is performed using a CT scanner machine without contrast. Generally, this is a safe procedure. Tell your health care provider if you are pregnant or may be pregnant. Ask your health care provider for any specific instructions on how to   prepare for this procedure. You can return to your normal activities after the scan is done. This information is not intended to replace advice given to you by your health care provider. Make sure you discuss any questions you have with your health care provider. Document Revised: 04/18/2021 Document Reviewed: 04/18/2021 Elsevier Patient Education  2024 [employer]   Cholesterol Content in  Foods Cholesterol is a waxy, fat-like substance that helps to carry fat in the blood. The body needs cholesterol in small amounts, but too much cholesterol can cause damage to the arteries and heart. What foods have cholesterol?  Cholesterol is found in animal-based foods, such as meat, seafood, and dairy. Generally, low-fat dairy and lean meats have less cholesterol than full-fat dairy and fatty meats. The milligrams of cholesterol per serving (mg per serving) of common cholesterol-containing foods are listed below. Meats and other proteins Egg -- one large whole egg has 186 mg. Veal shank -- 4 oz (113 g) has 141 mg. Lean ground [place] (93% lean) -- 4 oz (113 g) has 118 mg. Fat-trimmed lamb loin -- 4 oz (113 g) has 106 mg. Lean ground beef (90% lean) -- 4 oz (113 g) has 100 mg. Lobster -- 3.5 oz (99 g) has 90 mg. Pork loin chops -- 4 oz (113 g) has 86 mg. Canned salmon -- 3.5 oz (99 g) has 83 mg. Fat-trimmed beef top loin -- 4 oz (113 g) has 78 mg. Frankfurter -- 1 frank (3.5 oz or 99 g) has 77 mg. Crab -- 3.5 oz (99 g) has 71 mg. Roasted chicken without skin, white meat -- 4 oz (113 g) has 66 mg. Light bologna -- 2 oz (57 g) has 45 mg. Deli-cut [place] -- 2 oz (57 g) has 31 mg. Canned tuna -- 3.5 oz (99 g) has 31 mg. Tomasa Blase -- 1 oz (28 g) has 29 mg. Oysters and mussels (raw) -- 3.5 oz (99 g) has 25 mg. Mackerel -- 1 oz (28 g) has 22 mg. Trout -- 1 oz (28 g) has 20 mg. Pork sausage -- 1 link (1 oz or 28 g) has 17 mg. Salmon -- 1 oz (28 g) has 16 mg. Tilapia -- 1 oz (28 g) has 14 mg. Dairy Soft-serve ice cream --  cup (4 oz or 86 g) has 103 mg. Whole-milk yogurt -- 1 cup (8 oz or 245 g) has 29 mg. Cheddar cheese -- 1 oz (28 g) has 28 mg. American cheese -- 1 oz (28 g) has 28 mg. Whole milk -- 1 cup (8 oz or 250 mL) has 23 mg. 2% milk -- 1 cup (8 oz or 250 mL) has 18 mg. Cream cheese -- 1 tablespoon (Tbsp) (14.5 g) has 15 mg. Cottage cheese --  cup (4 oz or 113 g) has 14  mg. Low-fat (1%) milk -- 1 cup (8 oz or 250 mL) has 10 mg. Sour cream -- 1 Tbsp (12 g) has 8.5 mg. Low-fat yogurt -- 1 cup (8 oz or 245 g) has 8 mg. Nonfat Greek yogurt -- 1 cup (8 oz or 228 g) has 7 mg. Half-and-half cream -- 1 Tbsp (15 mL) has 5 mg. Fats and oils Cod liver oil -- 1 tablespoon (Tbsp) (13.6 g) has 82 mg. Butter -- 1 Tbsp (14 g) has 15 mg. Lard -- 1 Tbsp (12.8 g) has 14 mg. Bacon grease -- 1 Tbsp (12.9 g) has 14 mg. Mayonnaise -- 1 Tbsp (13.8 g) has 5-10 mg. Margarine -- 1 Tbsp (14  g) has 3-10 mg. The items listed above may not be a complete list of foods with cholesterol. Exact amounts of cholesterol in these foods may vary depending on specific ingredients and brands. Contact a dietitian for more information. What foods do not have cholesterol? Most plant-based foods do not have cholesterol unless you combine them with a food that has cholesterol. Foods without cholesterol include: Grains and cereals. Vegetables. Fruits. Vegetable oils, such as olive, canola, and sunflower oil. Legumes, such as peas, beans, and lentils. Nuts and seeds. Egg whites. The items listed above may not be a complete list of foods that do not have cholesterol. Contact a dietitian for more information. Summary The body needs cholesterol in small amounts, but too much cholesterol can cause damage to the arteries and heart. Cholesterol is found in animal-based foods, such as meat, seafood, and dairy. Generally, low-fat dairy and lean meats have less cholesterol than full-fat dairy and fatty meats. This information is not intended to replace advice given to you by your health care provider. Make sure you discuss any questions you have with your health care provider. Document Revised: 09/18/2020 Document Reviewed: 09/18/2020 Elsevier Patient Education  2024 [employer].

## 2023-09-27 NOTE — Assessment & Plan Note (Signed)
 She is having recurrent symptoms due to pollen, aware of OTC availability of medication. - Prescribe Zyrtec.

## 2023-09-27 NOTE — Assessment & Plan Note (Signed)
 She has gained 14 lbs since quitting smoking. She is encouraged to choose healthy snacks and to increase her daily activity. Will check labs as below. Encouraged to avoid sugary beverages/foods and processed meats.

## 2023-09-27 NOTE — Assessment & Plan Note (Signed)
 Chronic, fair control. Goal BP<120/80. She will c/w Tribenzor  40/5/25mg  daily. She is encouraged to follow low sodium diet. She will rto in four to six months for re-evaluation. She was congratulated on her lifestyle changes and encouraged to keep up the great work.

## 2023-09-27 NOTE — Progress Notes (Signed)
 I,Samantha Gomez, CMA,acting as a Neurosurgeon for Smiley Dung, MD.,have documented all relevant documentation on the behalf of Smiley Dung, MD,as directed by  Smiley Dung, MD while in the presence of Smiley Dung, MD.  Subjective:  Patient ID: Samantha Gomez , female    DOB: 1956/01/30 , 68 y.o.   MRN: [id number]  Chief Complaint  Patient presents with   Hyperlipidemia    Patient presents today for cholesterol & bp follow up. She reports compliance with medications. Denies headache, chest pain & sob. She questions as to why she has gained weight since last visit. She does not know if the cholesterol medication could be contributing to weight gain. She is actively in the gym & has good eating habits.    Hypertension    HPI Discussed the use of AI scribe software for clinical note transcription with the patient, who gave verbal consent to proceed.  History of Present Illness Samantha Gomez is a 68 year old female who presents for a cholesterol check.  She has been taking rosuvastatin  10 mg Monday through Friday, skipping weekends, with no issues. She aims to take it around 3 PM daily, though she occasionally forgets the exact time. She is also on Tribenzor  40/5/25 mg for hypertension and aspirin.  She has a history of allergies, particularly during high pollen levels, and is awaiting a prescription for Zyrtec.  She recently stopped smoking on July 14, 2023.   Hyperlipidemia This is a chronic problem. The current episode started more than 1 year ago. The problem is controlled. She has no history of chronic renal disease, liver disease or nephrotic syndrome.  Hypertension This is a chronic problem. The current episode started more than 1 year ago. The problem is controlled. Pertinent negatives include no blurred vision. Risk factors for coronary artery disease include smoking/tobacco exposure, stress and post-menopausal state. The current treatment provides moderate  improvement. Compliance problems include exercise.  There is no history of chronic renal disease.     Past Medical History:  Diagnosis Date   Allergy    Benign hypertensive heart disease    Chronic kidney disease    Disorder of nasal cavity    Encounter for annual health examination 06/27/2023   Hypertension      Family History  Problem Relation Age of Onset   Dementia Mother    Lung cancer Father      Current Outpatient Medications:    aspirin EC 81 MG tablet, Take 81 mg by mouth daily. Swallow whole., Disp: , Rfl:    b complex vitamins tablet, Take 1 tablet by mouth daily., Disp: , Rfl:    cetirizine (ZYRTEC ALLERGY) 10 MG tablet, Take 1 tablet (10 mg total) by mouth daily., Disp: 30 tablet, Rfl: 2   Cholecalciferol (VITAMIN D-3) 5000 units TABS, Take by mouth daily., Disp: , Rfl:    niacin 250 MG tablet, Take 250 mg by mouth daily., Disp: , Rfl:    rosuvastatin  (CRESTOR ) 10 MG tablet, Take one tablet by mouth daily Monday-Friday., Disp: 75 tablet, Rfl: 0   TRIBENZOR  40-5-25 MG TABS, TAKE 1 TABLET EVERY DAY, Disp: 90 tablet, Rfl: 3   No Known Allergies   Review of Systems  Constitutional: Negative.   Eyes:  Negative for blurred vision.  Respiratory: Negative.    Cardiovascular: Negative.   Gastrointestinal: Negative.   Neurological: Negative.   Psychiatric/Behavioral: Negative.       Today's Vitals   09/27/23 1055  BP: 130/80  Pulse: 72  Temp: 98.2 F (36.8 C)  SpO2: 98%  Weight: 169 lb 9.6 oz (76.9 kg)  Height: 5\' 4"  (1.626 m)   Body mass index is 29.11 kg/m.  Wt Readings from Last 3 Encounters:  09/27/23 169 lb 9.6 oz (76.9 kg)  06/27/23 155 lb (70.3 kg)  12/02/22 155 lb 9.6 oz (70.6 kg)     Objective:  Physical Exam Vitals and nursing note reviewed.  Constitutional:      Appearance: Normal appearance.  HENT:     Head: Normocephalic and atraumatic.  Eyes:     Extraocular Movements: Extraocular movements intact.  Cardiovascular:     Rate and  Rhythm: Normal rate and regular rhythm.     Heart sounds: Normal heart sounds.  Pulmonary:     Effort: Pulmonary effort is normal.     Breath sounds: Normal breath sounds.  Musculoskeletal:     Cervical back: Normal range of motion.  Skin:    General: Skin is warm.  Neurological:     General: No focal deficit present.     Mental Status: She is alert.  Psychiatric:        Mood and Affect: Mood normal.        Behavior: Behavior normal.         Assessment And Plan:  Pure hypercholesterolemia Assessment & Plan: Chronic, previously on atorvastatin  - stopped due to myalgias.  This is now managed with rosuvastatin  10 mg, no adverse effects. Cholesterol check planned. - Continue rosuvastatin  10 mg, Monday through Friday. - Order cholesterol panel. - We discussed the use of cardiac calcium  scoring in assessing cardiac risk. She declines at this time.   Orders: -     Lipid panel -     Hepatic function panel  Essential hypertension, benign Assessment & Plan: Chronic, fair control. Goal BP<120/80. She will c/w Tribenzor  40/5/25mg  daily. She is encouraged to follow low sodium diet. She will rto in four to six months for re-evaluation. She was congratulated on her lifestyle changes and encouraged to keep up the great work.    Seasonal allergic rhinitis due to pollen Assessment & Plan: She is having recurrent symptoms due to pollen, aware of OTC availability of medication. - Prescribe Zyrtec.   History of tobacco use Assessment & Plan: She is happy to report she stopped smoking on February 2025. She was congratulated on this! She still declines LDCT.     Weight gain Assessment & Plan: She has gained 14 lbs since quitting smoking. She is encouraged to choose healthy snacks and to increase her daily activity. Will check labs as below. Encouraged to avoid sugary beverages/foods and processed meats.   Orders: -     Hemoglobin A1c  Other orders -     Cetirizine HCl; Take 1 tablet  (10 mg total) by mouth daily.  Dispense: 30 tablet; Refill: 2   Return if symptoms worsen or fail to improve.  Patient was given opportunity to ask questions. Patient verbalized understanding of the plan and was able to repeat key elements of the plan. All questions were answered to their satisfaction.   I, Smiley Dung, MD, have reviewed all documentation for this visit. The documentation on 09/27/23 for the exam, diagnosis, procedures, and orders are all accurate and complete.   IF YOU HAVE BEEN REFERRED TO A SPECIALIST, IT MAY TAKE 1-2 WEEKS TO SCHEDULE/PROCESS THE REFERRAL. IF YOU HAVE NOT HEARD FROM US /SPECIALIST IN TWO WEEKS, PLEASE GIVE US  A CALL AT [phone] X  [phone].   THE PATIENT IS ENCOURAGED TO PRACTICE SOCIAL DISTANCING DUE TO THE COVID-19 PANDEMIC.

## 2023-09-27 NOTE — Assessment & Plan Note (Signed)
 She is happy to report she stopped smoking on February 2025. She was congratulated on this! She still declines LDCT.

## 2023-09-27 NOTE — Assessment & Plan Note (Addendum)
 Chronic, previously on atorvastatin  - stopped due to myalgias.  This is now managed with rosuvastatin  10 mg, no adverse effects. Cholesterol check planned. - Continue rosuvastatin  10 mg, Monday through Friday. - Order cholesterol panel. - We discussed the use of cardiac calcium  scoring in assessing cardiac risk. She declines at this time.

## 2023-09-28 LAB — HEMOGLOBIN A1C
Est. average glucose Bld gHb Est-mCnc: 120 mg/dL
Hgb A1c MFr Bld: 5.8 % — ABNORMAL HIGH (ref 4.8–5.6)

## 2023-09-28 LAB — LIPID PANEL
Chol/HDL Ratio: 1.8 ratio (ref 0.0–4.4)
Cholesterol, Total: 186 mg/dL (ref 100–199)
HDL: 102 mg/dL (ref >39)
LDL Chol Calc (NIH): 74 mg/dL (ref 0–99)
Triglycerides: 48 mg/dL (ref 0–149)
VLDL Cholesterol Cal: 10 mg/dL (ref 5–40)

## 2023-09-28 LAB — HEPATIC FUNCTION PANEL
ALT: 28 IU/L (ref 0–32)
AST: 23 IU/L (ref 0–40)
Albumin: 4.2 g/dL (ref 3.9–4.9)
Alkaline Phosphatase: 81 IU/L (ref 44–121)
Bilirubin Total: 0.4 mg/dL (ref 0.0–1.2)
Bilirubin, Direct: 0.16 mg/dL (ref 0.00–0.40)
Total Protein: 6.3 g/dL (ref 6.0–8.5)

## 2023-10-03 ENCOUNTER — Other Ambulatory Visit: Payer: Self-pay | Admitting: Internal Medicine

## 2023-10-03 DIAGNOSIS — E7800 Pure hypercholesterolemia, unspecified: Secondary | ICD-10-CM

## 2023-12-26 ENCOUNTER — Encounter: Payer: Self-pay | Admitting: Internal Medicine

## 2023-12-26 ENCOUNTER — Ambulatory Visit: Payer: Medicare PPO | Admitting: Internal Medicine

## 2023-12-26 VITALS — BP 130/82 | HR 81 | Temp 98.2°F | Ht 64.0 in | Wt 177.0 lb

## 2023-12-26 DIAGNOSIS — I10 Essential (primary) hypertension: Secondary | ICD-10-CM

## 2023-12-26 DIAGNOSIS — R7309 Other abnormal glucose: Secondary | ICD-10-CM | POA: Diagnosis not present

## 2023-12-26 DIAGNOSIS — R635 Abnormal weight gain: Secondary | ICD-10-CM

## 2023-12-26 DIAGNOSIS — E6609 Other obesity due to excess calories: Secondary | ICD-10-CM

## 2023-12-26 DIAGNOSIS — Z6830 Body mass index (BMI) 30.0-30.9, adult: Secondary | ICD-10-CM

## 2023-12-26 DIAGNOSIS — E7800 Pure hypercholesterolemia, unspecified: Secondary | ICD-10-CM | POA: Diagnosis not present

## 2023-12-26 NOTE — Patient Instructions (Signed)
 Hypertension, Adult Hypertension is another name for high blood pressure. High blood pressure forces your heart to work harder to pump blood. This can cause problems over time. There are two numbers in a blood pressure reading. There is a top number (systolic) over a bottom number (diastolic). It is best to have a blood pressure that is below 120/80. What are the causes? The cause of this condition is not known. Some other conditions can lead to high blood pressure. What increases the risk? Some lifestyle factors can make you more likely to develop high blood pressure: Smoking. Not getting enough exercise or physical activity. Being overweight. Having too much fat, sugar, calories, or salt (sodium) in your diet. Drinking too much alcohol. Other risk factors include: Having any of these conditions: Heart disease. Diabetes. High cholesterol. Kidney disease. Obstructive sleep apnea. Having a family history of high blood pressure and high cholesterol. Age. The risk increases with age. Stress. What are the signs or symptoms? High blood pressure may not cause symptoms. Very high blood pressure (hypertensive crisis) may cause: Headache. Fast or uneven heartbeats (palpitations). Shortness of breath. Nosebleed. Vomiting or feeling like you may vomit (nauseous). Changes in how you see. Very bad chest pain. Feeling dizzy. Seizures. How is this treated? This condition is treated by making healthy lifestyle changes, such as: Eating healthy foods. Exercising more. Drinking less alcohol. Your doctor may prescribe medicine if lifestyle changes do not help enough and if: Your top number is above 130. Your bottom number is above 80. Your personal target blood pressure may vary. Follow these instructions at home: Eating and drinking  If told, follow the DASH eating plan. To follow this plan: Fill one half of your plate at each meal with fruits and vegetables. Fill one fourth of your plate  at each meal with whole grains. Whole grains include whole-wheat pasta, brown rice, and whole-grain bread. Eat or drink low-fat dairy products, such as skim milk or low-fat yogurt. Fill one fourth of your plate at each meal with low-fat (lean) proteins. Low-fat proteins include fish, chicken without skin, eggs, beans, and tofu. Avoid fatty meat, cured and processed meat, or chicken with skin. Avoid pre-made or processed food. Limit the amount of salt in your diet to less than 1,500 mg each day. Do not drink alcohol if: Your doctor tells you not to drink. You are pregnant, may be pregnant, or are planning to become pregnant. If you drink alcohol: Limit how much you have to: 0-1 drink a day for women. 0-2 drinks a day for men. Know how much alcohol is in your drink. In the [place] one drink equals one 12 oz bottle of beer (355 mL), one 5 oz glass of wine (148 mL), or one 1 oz glass of hard liquor (44 mL). Lifestyle  Work with your doctor to stay at a healthy weight or to lose weight. Ask your doctor what the best weight is for you. Get at least 30 minutes of exercise that causes your heart to beat faster (aerobic exercise) most days of the week. This may include walking, swimming, or biking. Get at least 30 minutes of exercise that strengthens your muscles (resistance exercise) at least 3 days a week. This may include lifting weights or doing Pilates. Do not smoke or use any products that contain nicotine or tobacco. If you need help quitting, ask your doctor. Check your blood pressure at home as told by your doctor. Keep all follow-up visits. Medicines Take over-the-counter and prescription medicines  only as told by your doctor. Follow directions carefully. Do not skip doses of blood pressure medicine. The medicine does not work as well if you skip doses. Skipping doses also puts you at risk for problems. Ask your doctor about side effects or reactions to medicines that you should watch  for. Contact a doctor if: You think you are having a reaction to the medicine you are taking. You have headaches that keep coming back. You feel dizzy. You have swelling in your ankles. You have trouble with your vision. Get help right away if: You get a very bad headache. You start to feel mixed up (confused). You feel weak or numb. You feel faint. You have very bad pain in your: Chest. Belly (abdomen). You vomit more than once. You have trouble breathing. These symptoms may be an emergency. Get help right away. Call 911. Do not wait to see if the symptoms will go away. Do not drive yourself to the hospital. Summary Hypertension is another name for high blood pressure. High blood pressure forces your heart to work harder to pump blood. For most people, a normal blood pressure is less than 120/80. Making healthy choices can help lower blood pressure. If your blood pressure does not get lower with healthy choices, you may need to take medicine. This information is not intended to replace advice given to you by your health care provider. Make sure you discuss any questions you have with your health care provider. Document Revised: 02/25/2021 Document Reviewed: 02/25/2021 Elsevier Patient Education  2024 [employer].

## 2023-12-26 NOTE — Progress Notes (Unsigned)
 I,Jameka J Llittleton, CMA,acting as a Neurosurgeon for Catheryn LOISE Slocumb, MD.,have documented all relevant documentation on the behalf of Catheryn LOISE Slocumb, MD,as directed by  Catheryn LOISE Slocumb, MD while in the presence of Catheryn LOISE Slocumb, MD.  Subjective:  Patient ID: Samantha Gomez , female    DOB: 10/29/1955 , 68 y.o.   MRN: [id number]  Chief Complaint  Patient presents with  . Hypertension    Patient presents today for a bpc. Patient reports compliance with her meds. Patient denies having chest pain,sob or headaches at this time. Patient is concerned about gaining weight.     HPI  Hypertension This is a chronic problem. The current episode started more than 1 year ago. The problem is controlled. Pertinent negatives include no blurred vision. Risk factors for coronary artery disease include smoking/tobacco exposure, stress and post-menopausal state. The current treatment provides moderate improvement. Compliance problems include exercise.  There is no history of chronic renal disease.  Hyperlipidemia This is a chronic problem. The current episode started more than 1 year ago. The problem is controlled. She has no history of chronic renal disease, liver disease or nephrotic syndrome.     Past Medical History:  Diagnosis Date  . Allergy   . Benign hypertensive heart disease   . Chronic kidney disease   . Disorder of nasal cavity   . Encounter for annual health examination 06/27/2023  . Hypertension      Family History  Problem Relation Age of Onset  . Dementia Mother   . Lung cancer Father      Current Outpatient Medications:  .  aspirin EC 81 MG tablet, Take 81 mg by mouth daily. Swallow whole., Disp: , Rfl:  .  b complex vitamins tablet, Take 1 tablet by mouth daily., Disp: , Rfl:  .  cetirizine  (ZYRTEC  ALLERGY) 10 MG tablet, Take 1 tablet (10 mg total) by mouth daily., Disp: 30 tablet, Rfl: 2 .  Cholecalciferol (VITAMIN D-3) 5000 units TABS, Take by mouth daily., Disp: , Rfl:  .  niacin  250 MG tablet, Take 250 mg by mouth daily., Disp: , Rfl:  .  rosuvastatin  (CRESTOR ) 10 MG tablet, TAKE 1 BY MOUTH DAILY MONDAY-FRIDAY, Disp: 75 tablet, Rfl: 0 .  TRIBENZOR  40-5-25 MG TABS, TAKE 1 TABLET EVERY DAY, Disp: 90 tablet, Rfl: 3   No Known Allergies   Review of Systems  Constitutional: Negative.   Eyes:  Negative for blurred vision.  Respiratory: Negative.    Cardiovascular: Negative.   Gastrointestinal: Negative.   Neurological: Negative.   Psychiatric/Behavioral: Negative.       Today's Vitals   12/26/23 1156  BP: 130/82  Pulse: 81  Temp: 98.2 F (36.8 C)  TempSrc: Oral  Weight: 177 lb (80.3 kg)  Height: 5' 4 (1.626 m)  PainSc: 0-No pain   Body mass index is 30.38 kg/m.  Wt Readings from Last 3 Encounters:  12/26/23 177 lb (80.3 kg)  09/27/23 169 lb 9.6 oz (76.9 kg)  06/27/23 155 lb (70.3 kg)    The ASCVD Risk score (Arnett DK, et al., 2019) failed to calculate for the following reasons:   The valid HDL cholesterol range is 20 to 100 mg/dL  Objective:  Physical Exam Vitals and nursing note reviewed.  Constitutional:      Appearance: Normal appearance.  HENT:     Head: Normocephalic and atraumatic.  Eyes:     Extraocular Movements: Extraocular movements intact.  Cardiovascular:     Rate and Rhythm: Normal  rate and regular rhythm.     Heart sounds: Normal heart sounds.  Pulmonary:     Effort: Pulmonary effort is normal.     Breath sounds: Normal breath sounds.  Musculoskeletal:     Cervical back: Normal range of motion.  Skin:    General: Skin is warm.  Neurological:     General: No focal deficit present.     Mental Status: She is alert.  Psychiatric:        Mood and Affect: Mood normal.        Behavior: Behavior normal.         Assessment And Plan:  Essential hypertension, benign  Pure hypercholesterolemia  Class 1 obesity due to excess calories with body mass index (BMI) of 30.0 to 30.9 in adult, unspecified whether serious  comorbidity present    Return if symptoms worsen or fail to improve.  Patient was given opportunity to ask questions. Patient verbalized understanding of the plan and was able to repeat key elements of the plan. All questions were answered to their satisfaction.    I, Catheryn LOISE Slocumb, MD, have reviewed all documentation for this visit. The documentation on 12/26/23 for the exam, diagnosis, procedures, and orders are all accurate and complete.   IF YOU HAVE BEEN REFERRED TO A SPECIALIST, IT MAY TAKE 1-2 WEEKS TO SCHEDULE/PROCESS THE REFERRAL. IF YOU HAVE NOT HEARD FROM US /SPECIALIST IN TWO WEEKS, PLEASE GIVE US  A CALL AT [phone].

## 2023-12-27 LAB — CMP14+EGFR
ALT: 22 IU/L (ref 0–32)
AST: 24 IU/L (ref 0–40)
Albumin: 4.4 g/dL (ref 3.9–4.9)
Alkaline Phosphatase: 88 IU/L (ref 44–121)
BUN/Creatinine Ratio: 19 (ref 12–28)
BUN: 18 mg/dL (ref 8–27)
Bilirubin Total: 0.3 mg/dL (ref 0.0–1.2)
CO2: 24 mmol/L (ref 20–29)
Calcium: 9.5 mg/dL (ref 8.7–10.3)
Chloride: 102 mmol/L (ref 96–106)
Creatinine, Ser: 0.95 mg/dL (ref 0.57–1.00)
Globulin, Total: 2.1 g/dL (ref 1.5–4.5)
Glucose: 76 mg/dL (ref 70–99)
Potassium: 4.2 mmol/L (ref 3.5–5.2)
Sodium: 139 mmol/L (ref 134–144)
Total Protein: 6.5 g/dL (ref 6.0–8.5)
eGFR: 65 mL/min/1.73 (ref >59)

## 2023-12-27 LAB — TSH: TSH: 0.803 u[IU]/mL (ref 0.450–4.500)

## 2023-12-27 LAB — HEMOGLOBIN A1C
Est. average glucose Bld gHb Est-mCnc: 117 mg/dL
Hgb A1c MFr Bld: 5.7 % — ABNORMAL HIGH (ref 4.8–5.6)

## 2023-12-28 DIAGNOSIS — E6609 Other obesity due to excess calories: Secondary | ICD-10-CM | POA: Insufficient documentation

## 2023-12-28 NOTE — Assessment & Plan Note (Addendum)
 22-pound weight gain since February despite consistent eating habits and exercise. Potential metabolic slowdown due to one meal a day. Flavored powder in water may contribute to weight gain. - Order blood tests for liver and kidney function. - Order thyroid  function test. - Order A1c test for prediabetes. - Advise discontinuation of flavored powder in water. - Recommend splash of cranberry juice or lemon in water instead.

## 2023-12-28 NOTE — Assessment & Plan Note (Signed)
 Chronic, fair control. Goal BP<120/80. She will c/w Tribenzor  40/5/25mg  daily. She is encouraged to follow low sodium diet. She will rto in four to six months for re-evaluation. She was congratulated on her lifestyle changes and encouraged to keep up the great work.

## 2023-12-28 NOTE — Assessment & Plan Note (Signed)
Her BMI is acceptable for her demographic. She is encouraged to aim for at least 150 minutes of exercise/week.

## 2023-12-28 NOTE — Assessment & Plan Note (Signed)
 Previous labs reviewed, her A1c has been elevated in the past. I will check an A1c today. Reminded to avoid refined sugars including sugary drinks/foods and processed meats including bacon, sausages and deli meats.

## 2023-12-28 NOTE — Assessment & Plan Note (Signed)
 Chronic, previously on atorvastatin  - stopped due to myalgias.  This is now managed with rosuvastatin  10 mg, no adverse effects. Cholesterol check planned. - Continue rosuvastatin  10 mg, Monday through Friday.

## 2023-12-28 NOTE — Assessment & Plan Note (Signed)
 She is aware of 20+pound weight gain. Encouraged to avoid adding powder mixes to her water. Incorporate strength training into  her workout routine.  - Avoid sugary beverages and artificial sweeteners.

## 2023-12-31 ENCOUNTER — Ambulatory Visit: Payer: Self-pay | Admitting: Internal Medicine

## 2024-01-20 ENCOUNTER — Other Ambulatory Visit: Payer: Self-pay | Admitting: Internal Medicine

## 2024-01-20 DIAGNOSIS — E7800 Pure hypercholesterolemia, unspecified: Secondary | ICD-10-CM

## 2024-04-17 ENCOUNTER — Other Ambulatory Visit: Payer: Self-pay | Admitting: Internal Medicine

## 2024-04-17 DIAGNOSIS — E7800 Pure hypercholesterolemia, unspecified: Secondary | ICD-10-CM

## 2024-04-17 NOTE — Telephone Encounter (Signed)
 Patient requesting 90 day supply.

## 2024-04-17 NOTE — Telephone Encounter (Signed)
 Copied from CRM #[id number]. Topic: Clinical - Medication Refill >> Apr 17, 2024 10:42 AM Jasmin G wrote: Medication: rosuvastatin  (CRESTOR ) 10 MG tablet. Pt requested for a 90 day supply.  Has the patient contacted their pharmacy? Yes (Agent: If no, request that the patient contact the pharmacy for the refill. If patient does not wish to contact the pharmacy document the reason why and proceed with request.) (Agent: If yes, when and what did the pharmacy advise?)  This is the patient's preferred pharmacy:  [id number],  - [address] AT [facility] [address] [place] [address] Phone: [phone] Fax: [phone]  Is this the correct pharmacy for this prescription? Yes If no, delete pharmacy and type the correct one.   Has the prescription been filled recently? Yes  Is the patient out of the medication? No  Has the patient been seen for an appointment in the last year OR does the patient have an upcoming appointment? Yes  Can we respond through MyChart? No  Agent: Please be advised that Rx refills may take up to 3 business days. We ask that you follow-up with your pharmacy.

## 2024-04-23 MED ORDER — ROSUVASTATIN CALCIUM 10 MG PO TABS: ORAL_TABLET | ORAL | 0 refills | Status: AC

## 2024-05-06 ENCOUNTER — Other Ambulatory Visit: Payer: Self-pay | Admitting: Internal Medicine

## 2024-05-06 DIAGNOSIS — E7800 Pure hypercholesterolemia, unspecified: Secondary | ICD-10-CM

## 2024-05-20 ENCOUNTER — Other Ambulatory Visit: Payer: Self-pay | Admitting: Internal Medicine

## 2024-05-20 DIAGNOSIS — E7800 Pure hypercholesterolemia, unspecified: Secondary | ICD-10-CM

## 2024-05-30 ENCOUNTER — Other Ambulatory Visit: Payer: Self-pay | Admitting: Internal Medicine

## 2024-05-30 DIAGNOSIS — I10 Essential (primary) hypertension: Secondary | ICD-10-CM

## 2024-06-26 ENCOUNTER — Other Ambulatory Visit: Payer: Self-pay

## 2024-06-26 DIAGNOSIS — E7800 Pure hypercholesterolemia, unspecified: Secondary | ICD-10-CM

## 2024-06-26 MED ORDER — ROSUVASTATIN CALCIUM 10 MG PO TABS: ORAL_TABLET | ORAL | 2 refills | Status: AC

## 2024-06-27 ENCOUNTER — Encounter: Payer: Self-pay | Admitting: Internal Medicine

## 2024-07-17 ENCOUNTER — Ambulatory Visit: Payer: Self-pay | Admitting: Internal Medicine

## 2024-07-31 ENCOUNTER — Ambulatory Visit: Payer: Medicare PPO

## 2024-10-29 ENCOUNTER — Encounter: Admitting: Internal Medicine
